# Patient Record
Sex: Female | Born: 1985 | Race: White | Hispanic: No | Marital: Married | Smoking: Never smoker
Health system: Southern US, Academic
[De-identification: ages and names within clinical notes are randomized; demographics above are authoritative.]

## PROBLEM LIST (undated history)

## (undated) DIAGNOSIS — F32A Depression, unspecified: Secondary | ICD-10-CM

## (undated) DIAGNOSIS — R32 Unspecified urinary incontinence: Secondary | ICD-10-CM

## (undated) DIAGNOSIS — K219 Gastro-esophageal reflux disease without esophagitis: Secondary | ICD-10-CM

## (undated) DIAGNOSIS — T50905A Adverse effect of unspecified drugs, medicaments and biological substances, initial encounter: Secondary | ICD-10-CM

## (undated) DIAGNOSIS — F419 Anxiety disorder, unspecified: Secondary | ICD-10-CM

## (undated) DIAGNOSIS — R634 Abnormal weight loss: Secondary | ICD-10-CM

## (undated) DIAGNOSIS — D509 Iron deficiency anemia, unspecified: Secondary | ICD-10-CM

## (undated) DIAGNOSIS — R55 Syncope and collapse: Secondary | ICD-10-CM

## (undated) HISTORY — DX: Gastro-esophageal reflux disease without esophagitis: K21.9

## (undated) HISTORY — PX: HX APPENDECTOMY: SHX54

## (undated) HISTORY — DX: Unspecified urinary incontinence: R32

## (undated) HISTORY — DX: Anxiety disorder, unspecified: F41.9

## (undated) HISTORY — DX: Depression, unspecified: F32.A

## (undated) HISTORY — PX: HX GALL BLADDER SURGERY/CHOLE: SHX55

## (undated) HISTORY — DX: Adverse effect of unspecified drugs, medicaments and biological substances, initial encounter: T50.905A

## (undated) HISTORY — PX: HX SALPINGECTOMY: 2100001146

## (undated) HISTORY — DX: Abnormal weight loss: R63.4

## (undated) HISTORY — DX: Iron deficiency anemia, unspecified: D50.9

---
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
## 2015-05-17 DIAGNOSIS — K8020 Calculus of gallbladder without cholecystitis without obstruction: Secondary | ICD-10-CM | POA: Insufficient documentation

## 2015-05-25 DIAGNOSIS — Z9889 Other specified postprocedural states: Secondary | ICD-10-CM | POA: Insufficient documentation

## 2017-02-24 DIAGNOSIS — Z789 Other specified health status: Secondary | ICD-10-CM | POA: Insufficient documentation

## 2018-09-22 DIAGNOSIS — Z9889 Other specified postprocedural states: Secondary | ICD-10-CM | POA: Insufficient documentation

## 2019-05-13 DIAGNOSIS — Z3041 Encounter for surveillance of contraceptive pills: Secondary | ICD-10-CM | POA: Insufficient documentation

## 2020-02-01 DIAGNOSIS — Z01818 Encounter for other preprocedural examination: Secondary | ICD-10-CM | POA: Insufficient documentation

## 2020-02-24 DIAGNOSIS — Z4889 Encounter for other specified surgical aftercare: Secondary | ICD-10-CM | POA: Insufficient documentation

## 2021-11-19 ENCOUNTER — Other Ambulatory Visit (RURAL_HEALTH_CENTER): Payer: Self-pay | Admitting: Physician Assistant

## 2021-11-29 ENCOUNTER — Other Ambulatory Visit (RURAL_HEALTH_CENTER): Payer: Self-pay | Admitting: Physician Assistant

## 2021-11-29 MED ORDER — PANTOPRAZOLE 40 MG TABLET,DELAYED RELEASE
40.0000 mg | DELAYED_RELEASE_TABLET | Freq: Every day | ORAL | 0 refills | Status: DC
Start: 2021-11-29 — End: 2021-12-20

## 2021-11-29 MED ORDER — LORAZEPAM 0.5 MG TABLET
0.5000 mg | ORAL_TABLET | Freq: Two times a day (BID) | ORAL | 0 refills | Status: DC | PRN
Start: 2021-11-29 — End: 2021-12-20

## 2021-11-29 MED ORDER — TIRZEPATIDE 2.5 MG/0.5 ML SUBCUTANEOUS PEN INJECTOR
2.5000 mg | PEN_INJECTOR | Freq: Once | SUBCUTANEOUS | Status: DC
Start: 2021-11-29 — End: 2021-12-20

## 2021-11-29 NOTE — Telephone Encounter (Signed)
RX approved and encounter closed

## 2021-12-07 ENCOUNTER — Encounter (RURAL_HEALTH_CENTER): Payer: Self-pay | Admitting: Physician Assistant

## 2021-12-07 DIAGNOSIS — D509 Iron deficiency anemia, unspecified: Secondary | ICD-10-CM

## 2021-12-07 DIAGNOSIS — F419 Anxiety disorder, unspecified: Secondary | ICD-10-CM | POA: Insufficient documentation

## 2021-12-07 DIAGNOSIS — R32 Unspecified urinary incontinence: Secondary | ICD-10-CM | POA: Insufficient documentation

## 2021-12-07 DIAGNOSIS — K219 Gastro-esophageal reflux disease without esophagitis: Secondary | ICD-10-CM | POA: Insufficient documentation

## 2021-12-07 DIAGNOSIS — R634 Abnormal weight loss: Secondary | ICD-10-CM | POA: Insufficient documentation

## 2021-12-07 DIAGNOSIS — F32A Depression, unspecified: Secondary | ICD-10-CM | POA: Insufficient documentation

## 2021-12-20 ENCOUNTER — Ambulatory Visit (RURAL_HEALTH_CENTER): Payer: Managed Care, Other (non HMO) | Attending: Physician Assistant | Admitting: Physician Assistant

## 2021-12-20 ENCOUNTER — Other Ambulatory Visit: Payer: Self-pay

## 2021-12-20 ENCOUNTER — Encounter (RURAL_HEALTH_CENTER): Payer: Self-pay | Admitting: Physician Assistant

## 2021-12-20 VITALS — BP 112/82 | HR 77 | Temp 98.4°F | Ht 63.0 in | Wt 235.0 lb

## 2021-12-20 DIAGNOSIS — Z2839 Other underimmunization status: Secondary | ICD-10-CM | POA: Insufficient documentation

## 2021-12-20 DIAGNOSIS — D509 Iron deficiency anemia, unspecified: Secondary | ICD-10-CM | POA: Insufficient documentation

## 2021-12-20 DIAGNOSIS — F32A Depression, unspecified: Secondary | ICD-10-CM | POA: Insufficient documentation

## 2021-12-20 DIAGNOSIS — Z2821 Immunization not carried out because of patient refusal: Secondary | ICD-10-CM | POA: Insufficient documentation

## 2021-12-20 DIAGNOSIS — Z6841 Body Mass Index (BMI) 40.0 and over, adult: Secondary | ICD-10-CM | POA: Insufficient documentation

## 2021-12-20 DIAGNOSIS — Z28310 Unvaccinated for covid-19: Secondary | ICD-10-CM | POA: Insufficient documentation

## 2021-12-20 DIAGNOSIS — R0683 Snoring: Secondary | ICD-10-CM | POA: Insufficient documentation

## 2021-12-20 DIAGNOSIS — R635 Abnormal weight gain: Secondary | ICD-10-CM | POA: Insufficient documentation

## 2021-12-20 DIAGNOSIS — F419 Anxiety disorder, unspecified: Secondary | ICD-10-CM | POA: Insufficient documentation

## 2021-12-20 DIAGNOSIS — R7309 Other abnormal glucose: Secondary | ICD-10-CM | POA: Insufficient documentation

## 2021-12-20 DIAGNOSIS — R5383 Other fatigue: Secondary | ICD-10-CM | POA: Insufficient documentation

## 2021-12-20 DIAGNOSIS — E785 Hyperlipidemia, unspecified: Secondary | ICD-10-CM | POA: Insufficient documentation

## 2021-12-20 MED ORDER — PANTOPRAZOLE 40 MG TABLET,DELAYED RELEASE
40.0000 mg | DELAYED_RELEASE_TABLET | Freq: Every day | ORAL | 0 refills | Status: DC
Start: 2021-12-20 — End: 2022-02-11

## 2021-12-20 MED ORDER — MOUNJARO 2.5 MG/0.5 ML SUBCUTANEOUS PEN INJECTOR
2.5000 mg | PEN_INJECTOR | SUBCUTANEOUS | 2 refills | Status: DC
Start: 2021-12-20 — End: 2022-01-15

## 2021-12-20 MED ORDER — LORAZEPAM 0.5 MG TABLET
0.5000 mg | ORAL_TABLET | Freq: Two times a day (BID) | ORAL | 2 refills | Status: DC | PRN
Start: 2021-12-20 — End: 2022-03-21

## 2021-12-20 MED ORDER — MOUNJARO 2.5 MG/0.5 ML SUBCUTANEOUS PEN INJECTOR
2.5000 mg | PEN_INJECTOR | SUBCUTANEOUS | 2 refills | Status: DC
Start: 2021-12-20 — End: 2021-12-20

## 2021-12-20 NOTE — Nursing Note (Signed)
Patient in office office today for 6 month follow up . Patient voiced no new complains.

## 2021-12-20 NOTE — Progress Notes (Signed)
INTERNAL MEDICINE, [facility] INTERNAL MEDICINE [facility]  [address]  [place] [address]  Operated by [facility]  History and Physical     Name: [name] [name] MRN:  [id number]   Date: 12/20/2021 Age: 36 y.o.               Follow Up        Reason for Visit: Medication Refill and Follow Up 6 Months    History of Present Illness  [name] [name] is a 36 y.o. female who is being seen today in office for follow-up as well as complaints of increased fatigue.  Patient states that her husband has also told her that she snores now in previously had no history.  Patient has had fatigue for some time with noted history of iron deficiency for which she is on iron supplementation.  She states fatigue however is worse at this current time and she is not sure if related to the iron or something else.  She does need labs upon today's visit.  Last labs noted hemoglobin stable but iron sats very low.  She does have a history of heavy menses noted.    Follow-up anxiety/depression for which patient is currently taking Ativan 0.5 twice daily; currently at this time she is not on any medications for depression and thinks most of her issues are anxiety related; overall Ativan seems to help calm her and specifically in the evenings helps her rest; she denies any panic attacks new issues or concerns.    Follow-up abnormal glucose with family history of diabetes noted.  Previous hemoglobin A1c levels have been within normal range the patient in need of follow-up labs today.  She denies increased thirst urination but has had weight gain.  Patient is interested in getting a prescription for [facility] to see if it will help weight loss as well as sugar control.  She states her sister is currently taking it it seems to be working well for her.  Requesting prescription to pharmacy be sent.  Currently her weight is 235 with a BMI of 41.63.    Patient also has a history of elevated cholesterol however this time  not taking statin therapy.  Once again weight loss is needed in patient states that she has tried to watch her diet admits to very low activity.  Follow-up cholesterol levels needed today.    Patient follows with GYN who recently placed her on Vesicare 5 mg daily for urinary incontinence    COVID/flu vaccination refused  PHQ Questionnaire  Little interest or pleasure in doing things.: Not at all  Feeling down, depressed, or hopeless: Not at all  PHQ 2 Total: 0            Past Medical History:   Diagnosis Date   . Anxiety    . Depression    . Esophageal reflux    . Iron deficiency anemia, unspecified    . Unspecified urinary incontinence    . Weight loss due to medication          Past Surgical History:   Procedure Laterality Date   . HX APPENDECTOMY     . HX CHOLECYSTECTOMY     . HX SALPINGECTOMY        Family Medical History:     Problem Relation (Age of Onset)    Arthritis-osteo Father    Coronary Artery Disease Mother    Diabetes type II Maternal Grandmother    Hypothyroidism Sister  Social History     Tobacco Use   . Smoking status: Never   . Smokeless tobacco: Never   Substance Use Topics   . Alcohol use: Never     Medication:  polysaccharide iron complex (FERREX 150) 150 mg iron Oral Capsule, Take 1 Capsule (150 mg total) by mouth Twice daily  solifenacin (VESICARE) 5 mg Oral Tablet, Take 1 Tablet (5 mg total) by mouth Once a day  LORazepam (ATIVAN) 0.5 mg Oral Tablet, Take 1 Tablet (0.5 mg total) by mouth Twice per day as needed for Anxiety  pantoprazole (PROTONIX) 40 mg Oral Tablet, Delayed Release (E.C.), Take 1 Tablet (40 mg total) by mouth Once a day    tirzepatide Pen Injector 2.5 mg, 2.5 mg, Once      Allergies:  Allergies   Allergen Reactions   . Cefuroxime Axetil      Vomiting? Unsure to just po med.   . Wellbutrin [Bupropion]        Physical Exam:  Vitals:    12/20/21 1027   BP: 112/82   Pulse: 77   Temp: 36.9 C (98.4 F)   TempSrc: Tympanic   SpO2: 96%   Weight: 107 kg (235 lb)   Height:  1.6 m (5\' 3" )   BMI: 41.72      Physical Exam  Vitals and nursing note reviewed.   Constitutional:       Appearance: She is obese.   HENT:      Right Ear: Tympanic membrane normal.      Left Ear: Tympanic membrane normal.      Mouth/Throat:      Mouth: Mucous membranes are moist.      Pharynx: Oropharynx is clear.   Eyes:      Pupils: Pupils are equal, round, and reactive to light.   Cardiovascular:      Rate and Rhythm: Normal rate and regular rhythm.      Pulses: Normal pulses.   Pulmonary:      Effort: Pulmonary effort is normal.      Breath sounds: Normal breath sounds.   Abdominal:      General: Bowel sounds are normal.      Palpations: Abdomen is soft.      Tenderness: There is no abdominal tenderness.   Musculoskeletal:         General: No swelling or tenderness. Normal range of motion.      Cervical back: Normal range of motion and neck supple.   Skin:     General: Skin is warm.      Findings: No lesion or rash.   Neurological:      General: No focal deficit present.      Mental Status: She is alert and oriented to person, place, and time.   Psychiatric:         Mood and Affect: Mood normal.         Behavior: Behavior normal.         Thought Content: Thought content normal.         Judgment: Judgment normal.         Assessment/Plan:  Problem List Items Addressed This Visit        Hematologic/Lymphatic    Iron deficiency anemia, unspecified       Psychiatric    Depression    Anxiety   Other Visit Diagnoses     Fatigue, unspecified type    -  Primary    Relevant Orders    CBC/DIFF  COMPREHENSIVE METABOLIC PNL, FASTING    MAGNESIUM    IRON TRANSFERRIN AND TIBC    THYROID STIMULATING HORMONE WITH FREE T4 REFLEX    POLYSOMNOGRAPHY-ANP OVERNIGHT-1ST NIGHT OF STUDY (F/U WITH CPAP IF ANP MEETS CLINICAL CRITERIA)    Snoring        Relevant Orders    POLYSOMNOGRAPHY-ANP OVERNIGHT-1ST NIGHT OF STUDY (F/U WITH CPAP IF ANP MEETS CLINICAL CRITERIA)    Abnormal glucose        Relevant Orders    COMPREHENSIVE METABOLIC  PNL, FASTING    HGA1C (HEMOGLOBIN A1C WITH EST AVG GLUCOSE)    Hyperlipidemia, unspecified hyperlipidemia type        Relevant Orders    LIPID PANEL    Weight gain               Lab order given to obtained at lab [facility]  Order sleep study overnight oximetry  Will try to get Mounjaro 2.5mg  weekly  Strict diet exercise wt loss encouraged  Meds refilled as noted  F/u pending results otherwise as scheduled       Follow up: Return in about 3 months (around 03/21/2022) for In Person Visit.  Seek medical attention for new or worsening symptoms.  Patient has been seen in this clinic within the last 3 years.     Ellanora Rayborn, PA-C          This note was partially created using MModal Fluency Direct system (voice recognition software) and is inherently subject to errors including those of syntax and "sound-alike" substitutions which may escape proofreading.  In such instances, original meaning may be extrapolated by contextual derivation.

## 2022-01-14 ENCOUNTER — Telehealth (RURAL_HEALTH_CENTER): Payer: Self-pay | Admitting: Physician Assistant

## 2022-01-14 NOTE — Telephone Encounter (Signed)
Patient called and was wondering about the [facility] prior authorization. It was denied by insurance so patient was wondering what she could try next.

## 2022-01-15 MED ORDER — WEGOVY 0.25 MG/0.5 ML SUBCUTANEOUS PEN INJECTOR
0.2500 mg | PEN_INJECTOR | SUBCUTANEOUS | 0 refills | Status: DC
Start: 2022-01-15 — End: 2022-03-21

## 2022-01-23 ENCOUNTER — Other Ambulatory Visit: Payer: Self-pay

## 2022-01-23 ENCOUNTER — Ambulatory Visit (RURAL_HEALTH_CENTER): Payer: Managed Care, Other (non HMO) | Attending: Family | Admitting: Family

## 2022-01-23 ENCOUNTER — Encounter (RURAL_HEALTH_CENTER): Payer: Self-pay | Admitting: Family

## 2022-01-23 VITALS — BP 122/72 | HR 88 | Temp 98.0°F | Resp 18 | Ht 63.0 in | Wt 238.0 lb

## 2022-01-23 DIAGNOSIS — J329 Chronic sinusitis, unspecified: Secondary | ICD-10-CM | POA: Insufficient documentation

## 2022-01-23 DIAGNOSIS — R0989 Other specified symptoms and signs involving the circulatory and respiratory systems: Secondary | ICD-10-CM

## 2022-01-23 DIAGNOSIS — R059 Cough, unspecified: Secondary | ICD-10-CM

## 2022-01-23 DIAGNOSIS — J020 Streptococcal pharyngitis: Secondary | ICD-10-CM | POA: Insufficient documentation

## 2022-01-23 DIAGNOSIS — J309 Allergic rhinitis, unspecified: Secondary | ICD-10-CM | POA: Insufficient documentation

## 2022-01-23 DIAGNOSIS — J3489 Other specified disorders of nose and nasal sinuses: Secondary | ICD-10-CM

## 2022-01-23 DIAGNOSIS — R519 Headache, unspecified: Secondary | ICD-10-CM

## 2022-01-23 LAB — POCT RAPID FLU
INFLUENZA TYPE A: NEGATIVE
INFLUENZA TYPE B: NEGATIVE

## 2022-01-23 LAB — POCT RAPID STREP A: RAPID STREP A (POCT): NEGATIVE

## 2022-01-23 MED ORDER — PENICILLIN G BENZATHINE 1,200,000 UNIT/2 ML INTRAMUSCULAR SYRINGE
1.2000 10*6.[IU] | INJECTION | INTRAMUSCULAR | Status: AC
Start: 2022-01-23 — End: 2022-01-23
  Administered 2022-01-23: 1.2 10*6.[IU] via INTRAMUSCULAR

## 2022-01-23 MED ORDER — FLUTICASONE PROPIONATE 50 MCG/ACTUATION NASAL SPRAY,SUSPENSION
2.0000 | Freq: Every day | NASAL | 1 refills | Status: AC
Start: 2022-01-23 — End: 2022-02-06

## 2022-01-23 MED ORDER — CETIRIZINE 10 MG TABLET
10.0000 mg | ORAL_TABLET | Freq: Every day | ORAL | 0 refills | Status: DC
Start: 2022-01-23 — End: 2022-05-23

## 2022-01-23 MED ORDER — PSEUDOEPHEDRINE-GUAIFENESIN ER 60 MG-600 MG TABLET,EXTEND RELEASE 12HR
1.0000 | ORAL_TABLET | Freq: Two times a day (BID) | ORAL | 0 refills | Status: AC
Start: 2022-01-23 — End: 2022-01-30

## 2022-01-23 MED ORDER — PREDNISONE 20 MG TABLET
40.0000 mg | ORAL_TABLET | Freq: Every day | ORAL | 0 refills | Status: AC
Start: 2022-01-23 — End: 2022-01-28

## 2022-01-23 NOTE — Nursing Note (Signed)
Patient here today with complaints of  Sore throat earache, cough, and diarrhea. Patient states this has been going on for about 3 weeks now.

## 2022-01-23 NOTE — Progress Notes (Signed)
FAMILY MEDICINE, [facility] FAMILY MEDICINE [facility]  [address]  [place] [address]  Operated by [facility]     Name: [name] [name] MRN:  [id number]   Date of Birth: 20-Jun-1986 Age: 36 y.o.   Date: 01/23/2022  Time: 15:24     Provider: Asa Lente, FNP    Reason for visit: Sore Throat and Cough (Congestion/)      History of Present Illness:  [name] [name] is a 36 y.o. female present to clinic today with c/o a 3 weeks of viral like symptoms. Which included sore throat,ear pain, cough and congestion. She tells me in the begining that [facility] and [facility] with [facility] for a virus. She tells me that at that time she was tested for covid, flu and strep, all were negative.  She tells me it improved with the abx until this past Friday, all the symptoms came back. By Saturday morning all symptoms had returned plus nausea, vomiting and diarrhea.    On Monday, she went back to [facility] and was start on another abx: Omnicef. But she tells me the coughing and the congestion has not improved. The ears and sore throat remains.   She not taking any OTC medications.   She tells on Saturday she had a lot of sinus pressure and headache. At that time she took some tylenol.   She goes on to further explain that her boys have been participating in baseball tournaments for the past 2 weeks and she had '' little to no rest"     Historical Data    Past Medical History:  Past Medical History:   Diagnosis Date   . Anxiety    . Depression    . Esophageal reflux    . Iron deficiency anemia, unspecified    . Unspecified urinary incontinence    . Weight loss due to medication      Past Surgical History:  Past Surgical History:   Procedure Laterality Date   . HX APPENDECTOMY     . HX CHOLECYSTECTOMY     . HX SALPINGECTOMY       Allergies:  Allergies   Allergen Reactions   . Cefuroxime Axetil      Vomiting? Unsure to just po med.   . Wellbutrin [Bupropion]      Medications:  Current  Outpatient Medications   Medication Sig   . cetirizine (ZYRTEC) 10 mg Oral Tablet Take 1 Tablet (10 mg total) by mouth Once a day for 90 days Indications: seasonal runny nose   . fluticasone propionate (FLONASE) 50 mcg/actuation Nasal Spray, Suspension Administer 2 Sprays into each nostril Once a day for 14 days Then decrease to 1 spray in each nostril to continue through allergy season.   Marland Kitchen LORazepam (ATIVAN) 0.5 mg Oral Tablet Take 1 Tablet (0.5 mg total) by mouth Twice per day as needed for Anxiety   . pantoprazole (PROTONIX) 40 mg Oral Tablet, Delayed Release (E.C.) Take 1 Tablet (40 mg total) by mouth Once a day   . polysaccharide iron complex (FERREX 150) 150 mg iron Oral Capsule Take 1 Capsule (150 mg total) by mouth Twice daily   . predniSONE (DELTASONE) 20 mg Oral Tablet Take 2 Tablets (40 mg total) by mouth Once a day for 5 days Indications: inflammation of the nose due to an allergy   . Pseudoephedrine-Guaifenesin (MUCINEX D) 60-600 mg Oral Tablet Sustained Release 12 hr Take 1 Tablet by mouth  Every 12 hours for 7 days Indications: a stuffy and runny nose, stuffy nose   . semaglutide, weight loss, (WEGOVY) 0.25 mg/0.5 mL Subcutaneous Pen Injector Inject 0.5 mL (0.25 mg total) under the skin Every 7 days for 30 days (Patient not taking: Reported on 01/23/2022)   . solifenacin (VESICARE) 5 mg Oral Tablet Take 1 Tablet (5 mg total) by mouth Once a day     Family History:  Family Medical History:     Problem Relation (Age of Onset)    Arthritis-osteo Father    Coronary Artery Disease Mother    Diabetes type II Maternal Grandmother    Hypothyroidism Sister          Social History:  Social History     Socioeconomic History   . Marital status: Married   Tobacco Use   . Smoking status: Never   . Smokeless tobacco: Never   Substance and Sexual Activity   . Alcohol use: Never           Review of Systems:  Any pertinent Review of Systems as addressed in the HPI above.    Physical Exam:  Vital Signs:  Vitals:     01/23/22 1428   BP: 122/72   Pulse: 88   Resp: 18   Temp: 36.7 C (98 F)   TempSrc: Oral   SpO2: 98%   Weight: 108 kg (238 lb)   Height: 1.6 m (5\' 3" )   BMI: 42.25     Physical Exam  Constitutional:       General: She is not in acute distress.     Appearance: Normal appearance. She is not ill-appearing (obviously not feeling her best).   HENT:      Head: Normocephalic.      Right Ear: Ear canal and external ear normal. Tympanic membrane is bulging (mild). Tympanic membrane is not injected.      Left Ear: Ear canal and external ear normal. Tympanic membrane is bulging (mild). Tympanic membrane is not injected.      Nose: Mucosal edema, congestion and rhinorrhea present. Rhinorrhea is purulent.      Mouth/Throat:      Lips: Pink.      Mouth: Mucous membranes are moist.      Pharynx: Pharyngeal swelling and posterior oropharyngeal erythema present. No oropharyngeal exudate.      Tonsils: No tonsillar exudate.      Comments: Congested cough  Eyes:      General: Lids are normal. Allergic shiner present.      Conjunctiva/sclera: Conjunctivae normal.   Cardiovascular:      Rate and Rhythm: Normal rate and regular rhythm.      Pulses: Normal pulses.      Heart sounds: Normal heart sounds. No murmur heard.  Pulmonary:      Effort: Pulmonary effort is normal.      Breath sounds: Normal breath sounds. No wheezing, rhonchi or rales.   Abdominal:      Palpations: Abdomen is soft.   Musculoskeletal:      Cervical back: Neck supple. Muscular tenderness present.   Lymphadenopathy:      Cervical: Cervical adenopathy present.   Skin:     General: Skin is warm.      Capillary Refill: Capillary refill takes less than 2 seconds.   Neurological:      General: No focal deficit present.      Mental Status: She is alert and oriented to person, place, and time. Mental status is at  baseline.          Assessment and Plan:    ICD-10-CM    1. Strep pharyngitis  J02.0       2. Allergic rhinitis  J30.9       3. Sinusitis  J32.9       4. Cough   R05.9       5. Chest congestion  R09.89       6. Headache  R51.9       7. Sinus pressure  J34.89          Orders Placed This Encounter   . fluticasone propionate (FLONASE) 50 mcg/actuation Nasal Spray, Suspension   . cetirizine (ZYRTEC) 10 mg Oral Tablet   . Pseudoephedrine-Guaifenesin (MUCINEX D) 60-600 mg Oral Tablet Sustained Release 12 hr   . predniSONE (DELTASONE) 20 mg Oral Tablet   . penicillin G benzathine (BICILLIN LA) 1,200,000 units/2 mL IM injection     This is probably a sinusitis/ rhinitis that has progressed due to seasonal allergies and then she has developed an exposure to strep. She has completed a round of doxycycline, and is currently on Omnicef. I will give her a shot of PCN G for the strep. The others should take care of what's left of the sinusitis.   I am also prescribing a bust of steroids and 5 days of Sudafed with guaifenesin, to reduce the inflammation and relieve the congestion. I have advised her to start Flonase and zyrtec to prevent the allergies. Explaining that the Flonase will also help relieve the ear pain/ pressure. She can continue to use acetaminophen for discomfort.   Plan of care discussed at length and agreed upon.    I emphasized the need for her to make  time to rest in order to start feeling better. Reminding her to push fluids, and discard her toothbrush in 24-48 hours.    Written info printed.     Zamarion Longest L Coleman Kalas, FNP     Portions of this note may be dictated using voice recognition software or a dictation service. Variances in spelling and vocabulary are possible and unintentional. Not all errors are caught/corrected. Please notify the Thereasa Parkin if any discrepancies are noted or if the meaning of any statement is not clear.

## 2022-02-08 ENCOUNTER — Other Ambulatory Visit (RURAL_HEALTH_CENTER): Payer: Self-pay | Admitting: Physician Assistant

## 2022-02-18 ENCOUNTER — Encounter (HOSPITAL_COMMUNITY): Payer: Self-pay

## 2022-02-18 ENCOUNTER — Ambulatory Visit (HOSPITAL_COMMUNITY): Payer: Self-pay

## 2022-03-13 ENCOUNTER — Other Ambulatory Visit (RURAL_HEALTH_CENTER): Payer: Self-pay

## 2022-03-13 DIAGNOSIS — R5383 Other fatigue: Secondary | ICD-10-CM

## 2022-03-13 DIAGNOSIS — E785 Hyperlipidemia, unspecified: Secondary | ICD-10-CM

## 2022-03-13 DIAGNOSIS — R7309 Other abnormal glucose: Secondary | ICD-10-CM

## 2022-03-21 ENCOUNTER — Encounter (INDEPENDENT_AMBULATORY_CARE_PROVIDER_SITE_OTHER): Payer: Self-pay | Admitting: Physician Assistant

## 2022-03-21 ENCOUNTER — Other Ambulatory Visit: Payer: Self-pay

## 2022-03-21 ENCOUNTER — Ambulatory Visit: Payer: Managed Care, Other (non HMO) | Attending: Physician Assistant | Admitting: Physician Assistant

## 2022-03-21 VITALS — BP 124/74 | HR 79 | Temp 97.8°F | Ht 63.0 in | Wt 239.0 lb

## 2022-03-21 DIAGNOSIS — F32A Depression, unspecified: Secondary | ICD-10-CM | POA: Insufficient documentation

## 2022-03-21 DIAGNOSIS — R5382 Chronic fatigue, unspecified: Secondary | ICD-10-CM | POA: Insufficient documentation

## 2022-03-21 DIAGNOSIS — F419 Anxiety disorder, unspecified: Secondary | ICD-10-CM | POA: Insufficient documentation

## 2022-03-21 DIAGNOSIS — R7309 Other abnormal glucose: Secondary | ICD-10-CM | POA: Insufficient documentation

## 2022-03-21 DIAGNOSIS — E782 Mixed hyperlipidemia: Secondary | ICD-10-CM | POA: Insufficient documentation

## 2022-03-21 DIAGNOSIS — R635 Abnormal weight gain: Secondary | ICD-10-CM | POA: Insufficient documentation

## 2022-03-21 DIAGNOSIS — D509 Iron deficiency anemia, unspecified: Secondary | ICD-10-CM | POA: Insufficient documentation

## 2022-03-21 MED ORDER — WEGOVY 0.25 MG/0.5 ML SUBCUTANEOUS PEN INJECTOR
0.2500 mg | PEN_INJECTOR | SUBCUTANEOUS | 0 refills | Status: DC
Start: 2022-03-21 — End: 2022-05-23

## 2022-03-21 MED ORDER — LORAZEPAM 0.5 MG TABLET
0.5000 mg | ORAL_TABLET | Freq: Two times a day (BID) | ORAL | 2 refills | Status: DC | PRN
Start: 2022-03-21 — End: 2022-05-23

## 2022-03-21 NOTE — Nursing Note (Signed)
Patient in office for 3 month visit with refills.

## 2022-03-21 NOTE — Progress Notes (Signed)
INTERNAL MEDICINE, [facility]  [address]  [place] [address]  Operated by [facility]  History and Physical     Name: [name] [name] MRN:  [id number]   Date: 03/21/2022 Age: 36 y.o.               Follow Up        Reason for Visit: Follow Up 3 Months weight gain    History of Present Illness  [name] [name] is a 36 y.o. female who is being seen today in office for follow-up today.  Patient does have a history of abnormal glucose with family history of diabetes noted.  Previous hemoglobin A1c levels have been within normal range and patient was given Mounjaro to see if it will help weight loss as well as sugar control.  Patient states her insurance would not pay for the medication so she is currently not taking anything and weight gain is noted.  Currently her weight is 235 with a BMI of 41.63.  She is requesting today to see if something else such as Reginal Lutes could be sent to help with weight loss.  Patient states her goal weight would be around 150-160 if possible but she has a hard time finding time to exercise was work and kids in running etc. at this time no increased thirst urination reported.    Follow-up anxiety/depression for which patient is currently taking Ativan 0.5 twice daily; currently at this time she is not on any medications for depression and thinks most of her issues are anxiety related.  Reports Ativan seems to help calm her and specifically in the evenings helps her rest; she denies any panic attacks new issues or concerns.  Requesting medication refill.    Follow-up history of elevated cholesterol/triglycerides was last reading 225.  Patient this time is not on statin therapy.  Once again weight loss is needed in patient states that she has tried to watch her diet admits to very low activity.      Follow-up low iron with patient now taking iron supplement.  She does have a history of heavy menses as well as chronic fatigue noted.  Denies any abdominal pain bowel  changes.  We also discussed possibility of sleep apnea causing some of her fatigue issues overnight sleep test was ordered but patient states she never completed the test at this time would like to wait.    Patient follows with GYN routinely.  He also prescribes her Vesicare 5 mg daily for urinary incontinence    PHQ Questionnaire  Little interest or pleasure in doing things.: Not at all  Feeling down, depressed, or hopeless: Not at all  PHQ 2 Total: 0            Past Medical History:   Diagnosis Date    Anxiety     Depression     Esophageal reflux     Iron deficiency anemia, unspecified     Unspecified urinary incontinence     Weight loss due to medication          Past Surgical History:   Procedure Laterality Date    HX APPENDECTOMY      HX CHOLECYSTECTOMY      HX SALPINGECTOMY        Family Medical History:       Problem Relation (Age of Onset)    Arthritis-osteo Father    Coronary Artery Disease Mother    Diabetes type II Maternal Grandmother  Hypothyroidism Sister            Social History     Tobacco Use    Smoking status: Never    Smokeless tobacco: Never   Substance Use Topics    Alcohol use: Never     Medication:  cetirizine (ZYRTEC) 10 mg Oral Tablet, Take 1 Tablet (10 mg total) by mouth Once a day for 90 days Indications: seasonal runny nose (Patient not taking: Reported on 03/21/2022)  polysaccharide iron complex (FERREX 150) 150 mg iron Oral Capsule, Take 1 Capsule (150 mg total) by mouth Twice daily  solifenacin (VESICARE) 5 mg Oral Tablet, Take 1 Tablet (5 mg total) by mouth Once a day  LORazepam (ATIVAN) 0.5 mg Oral Tablet, Take 1 Tablet (0.5 mg total) by mouth Twice per day as needed for Anxiety  pantoprazole (PROTONIX) 40 mg Oral Tablet, Delayed Release (E.C.), TAKE 1 TABLET by mouth ONCE DAILY  semaglutide, weight loss, (WEGOVY) 0.25 mg/0.5 mL Subcutaneous Pen Injector, Inject 0.5 mL (0.25 mg total) under the skin Every 7 days for 30 days (Patient not taking: Reported on 01/23/2022)    No  facility-administered medications prior to visit.    Allergies:  Allergies   Allergen Reactions    Cefuroxime Axetil      Vomiting? Unsure to just po med.    Wellbutrin [Bupropion]        Physical Exam:  Vitals:    03/21/22 1022   BP: 124/74   Pulse: 79   Temp: 36.6 C (97.8 F)   TempSrc: Tympanic   SpO2: 97%   Weight: 108 kg (239 lb)   Height: 1.6 m (5\' 3" )   BMI: 42.43      Physical Exam  Vitals and nursing note reviewed.   Constitutional:       Appearance: She is obese.   HENT:      Right Ear: Tympanic membrane normal.      Left Ear: Tympanic membrane normal.      Mouth/Throat:      Mouth: Mucous membranes are moist.      Pharynx: Oropharynx is clear.   Eyes:      Pupils: Pupils are equal, round, and reactive to light.   Cardiovascular:      Rate and Rhythm: Normal rate and regular rhythm.      Pulses: Normal pulses.   Pulmonary:      Effort: Pulmonary effort is normal.      Breath sounds: Normal breath sounds.   Abdominal:      General: Bowel sounds are normal.      Palpations: Abdomen is soft.      Tenderness: There is no abdominal tenderness.   Musculoskeletal:         General: No swelling or tenderness. Normal range of motion.      Cervical back: Normal range of motion and neck supple.   Skin:     General: Skin is warm.      Findings: No lesion or rash.   Neurological:      General: No focal deficit present.      Mental Status: She is alert and oriented to person, place, and time.   Psychiatric:         Mood and Affect: Mood normal.         Behavior: Behavior normal.         Thought Content: Thought content normal.         Judgment: Judgment normal.  Assessment/Plan:  Problem List Items Addressed This Visit          Cardiovascular System    Elevated triglycerides with high cholesterol       Hematologic/Lymphatic    Iron deficiency anemia, unspecified       Psychiatric    Depression    Anxiety       Other    Abnormal glucose    Chronic fatigue     Other Visit Diagnoses       Weight gain    -  Primary            Labs up-to-date this time  Wegovy 0.25 mg weekly prescribed pending insurance approval  To continue Strict diet exercise wt loss   Ativan refilled as noted  Once again patient did not complete overnight sleep study and refuses reordered this time       Follow up:   Post-Discharge Follow Up Appointments       Thursday Jun 20, 2022    Appointment with Adline Mango, MD at 12:15 PM      Tuesday Jun 25, 2022    Return Patient Visit with Eyvonne Mechanic, PA-C at  9:00 AM      Thursday Aug 29, 2022    Appointment with Eyvonne Mechanic, PA-C at  9:30 AM      Internal Medicine, Building [facility]  [facility]  [address]  [place] [address]  [phone]           Seek medical attention for new or worsening symptoms.  Patient has been seen in this clinic within the last 3 years.       I personally reviewed the documentation of care provided by the certified physician assistant and I agree with her medical decision making, Weyman Pedro, D.O.     Amy Alvis, PA-C          This note was partially created using MModal Fluency Direct system (voice recognition software) and is inherently subject to errors including those of syntax and "sound-alike" substitutions which may escape proofreading.  In such instances, original meaning may be extrapolated by contextual derivation.

## 2022-03-22 ENCOUNTER — Other Ambulatory Visit (RURAL_HEALTH_CENTER): Payer: Self-pay | Admitting: Physician Assistant

## 2022-03-22 ENCOUNTER — Telehealth (INDEPENDENT_AMBULATORY_CARE_PROVIDER_SITE_OTHER): Payer: Self-pay | Admitting: Physician Assistant

## 2022-03-22 MED ORDER — PHENTERMINE 37.5 MG TABLET
37.5000 mg | ORAL_TABLET | Freq: Every morning | ORAL | 1 refills | Status: DC
Start: 2022-03-22 — End: 2022-03-27

## 2022-03-22 NOTE — Telephone Encounter (Signed)
PATIENT CALLED SAYING MONJARO WASN'T APPROVED AND SAID YOU WILL SENT HER IN PILLS.

## 2022-03-27 ENCOUNTER — Inpatient Hospital Stay (HOSPITAL_BASED_OUTPATIENT_CLINIC_OR_DEPARTMENT_OTHER)
Admission: RE | Admit: 2022-03-27 | Discharge: 2022-03-27 | Disposition: A | Discharge status: Home / Self Care | Payer: Managed Care, Other (non HMO) | Source: Ambulatory Visit

## 2022-03-27 ENCOUNTER — Ambulatory Visit: Payer: Managed Care, Other (non HMO) | Attending: Physician Assistant | Admitting: Physician Assistant

## 2022-03-27 ENCOUNTER — Other Ambulatory Visit: Payer: Self-pay

## 2022-03-27 ENCOUNTER — Other Ambulatory Visit (INDEPENDENT_AMBULATORY_CARE_PROVIDER_SITE_OTHER): Payer: Self-pay | Admitting: Physician Assistant

## 2022-03-27 ENCOUNTER — Inpatient Hospital Stay (HOSPITAL_BASED_OUTPATIENT_CLINIC_OR_DEPARTMENT_OTHER)
Admission: RE | Admit: 2022-03-27 | Discharge: 2022-03-27 | Disposition: A | Discharge status: Home / Self Care | Payer: Managed Care, Other (non HMO) | Source: Ambulatory Visit | Attending: Physician Assistant | Admitting: Physician Assistant

## 2022-03-27 ENCOUNTER — Encounter (INDEPENDENT_AMBULATORY_CARE_PROVIDER_SITE_OTHER): Payer: Self-pay | Admitting: Physician Assistant

## 2022-03-27 VITALS — BP 112/86 | HR 86 | Temp 98.6°F | Ht 63.0 in | Wt 235.0 lb

## 2022-03-27 DIAGNOSIS — R1030 Lower abdominal pain, unspecified: Secondary | ICD-10-CM

## 2022-03-27 DIAGNOSIS — R55 Syncope and collapse: Secondary | ICD-10-CM

## 2022-03-27 DIAGNOSIS — R5383 Other fatigue: Secondary | ICD-10-CM | POA: Insufficient documentation

## 2022-03-27 DIAGNOSIS — E782 Mixed hyperlipidemia: Secondary | ICD-10-CM

## 2022-03-27 DIAGNOSIS — R809 Proteinuria, unspecified: Secondary | ICD-10-CM | POA: Insufficient documentation

## 2022-03-27 NOTE — Nursing Note (Signed)
03/27/22 1500   Urine test  (Siemens Multistix 10 SG)   Performed Status: Manual   Time collected 1531   Color (Ref Range: Yellow) (!) Dark Yellow   Clarity (Ref Range: Clear) (!) Cloudy   Glucose (Ref Range: Negative mg/dL) Negative   Bilirubin (Ref Range: Negative mg/dL) (!) 1+   Ketones (Ref Range: Negative mg/dL) Negative   Urine Specific Gravity (Ref Range: 1.005 - 1.030) 1.015   Blood (urine) (Ref Range: Negative mg/dL) Negative   pH (Ref Range: 5.0 - 8.0) 6.5   Protein (Ref Range: Negative mg/dL) (!) Trace   Urobilinogen (Ref Range: Negative mg/dL) 0.2mg /dL (Normal)   Nitrite (Ref Range: Negative) Negative   Leukocytes (Ref Range: Negative WBC's/uL) Negative   Initials KD

## 2022-03-27 NOTE — Progress Notes (Signed)
[facility] Medicine  INTERNAL MEDICINE, BUILDING A  Operated [facility]  Progress Note    Name: [name] [name] MRN:  [id number]   Date: 03/27/2022 Age: 36 y.o.          Chief Complaint: Syncope       HPI: [name] [name] is a 36 y.o. female who complains of  syncope yesterday after experiencing acute lower abdominal pain.  Patient states that she would taken a shower and upon getting out of the shower had acute lower abdominal pain that she described as severe like when she had contractions during labor.  Patient states after that she does not remember approximally 5 minutes of time where she apparently called her sister mother husband without recollection.  Her mother and sister arrived to her home at which time found her in the bathroom confused to her surrounding and still complaining of abdominal pain.  Patient states at that time she had a bowel movement which was not loose or diarrhea but very yellow in color.  After the bowel movement pain was resolved but she had a tingling sensation all over last drank a protein drink and 15-20 minutes later felt significantly better.  Patient states that she was fully hydrated prior to the incident and had drank 340 oz Stanley cups follow water as well as 3 bottles of water.  She had no new medications and had ate shortly before incident to does not believe it was related to a low sugar attack.  Patient states that she is had episodes like this off and on her entire life starting when she was age 42.  She was taken to a neurologist years ago who told her she had "inch warm blood supply" today felt like she needs to stay hydrated in order to keep her blood moving properly.  Patient did admit to taking the Adipex yesterday morning but states she is used this medication in ever had any kind of issues.  She denied any other headaches dizziness chest pain and today feels fine other than states she is a little more tired.  Patient also knows that right after her issue  blood pressure was low at 76/59 but her sugar was 124    Allergies:  Allergies   Allergen Reactions    Cefuroxime Axetil      Vomiting? Unsure to just po med.    Wellbutrin [Bupropion]      Current medications:  cetirizine (ZYRTEC) 10 mg Oral Tablet, Take 1 Tablet (10 mg total) by mouth Once a day for 90 days Indications: seasonal runny nose (Patient not taking: Reported on 03/21/2022)  LORazepam (ATIVAN) 0.5 mg Oral Tablet, Take 1 Tablet (0.5 mg total) by mouth Twice per day as needed for Anxiety  pantoprazole (PROTONIX) 40 mg Oral Tablet, Delayed Release (E.C.), TAKE 1 TABLET by mouth ONCE DAILY  polysaccharide iron complex (FERREX 150) 150 mg iron Oral Capsule, Take 1 Capsule (150 mg total) by mouth Twice daily  semaglutide, weight loss, (WEGOVY) 0.25 mg/0.5 mL Subcutaneous Pen Injector, Inject 0.5 mL (0.25 mg total) under the skin Every 7 days for 30 days  solifenacin (VESICARE) 5 mg Oral Tablet, Take 1 Tablet (5 mg total) by mouth Once a day  phentermine (ADIPEX-P) 37.5 mg Oral Tablet, Take 1 Tablet (37.5 mg total) by mouth Every morning before breakfast    No facility-administered medications prior to visit.     Social History     Socioeconomic History    Marital status: Married  Tobacco Use    Smoking status: Never    Smokeless tobacco: Never   Substance and Sexual Activity    Alcohol use: Never      Past Surgical History:   Procedure Laterality Date    HX APPENDECTOMY      HX CHOLECYSTECTOMY      HX SALPINGECTOMY        Past Medical History:   Diagnosis Date    Anxiety     Depression     Esophageal reflux     Iron deficiency anemia, unspecified     Unspecified urinary incontinence     Weight loss due to medication       OBJECTIVE:  Vitals:    03/27/22 1347 03/27/22 1348 03/27/22 1349   BP: 118/88 118/84 112/86   Pulse: 86     Temp: 37 C (98.6 F)     TempSrc: Tympanic     SpO2: 98%     Weight: 107 kg (235 lb)     Height: 1.6 m (5\' 3" )     BMI: 41.72        Physical Exam  Vitals and nursing note reviewed.    Constitutional:       Appearance: Normal appearance. She is obese.   HENT:      Right Ear: Tympanic membrane normal.      Left Ear: Tympanic membrane normal.      Mouth/Throat:      Mouth: Mucous membranes are moist.      Pharynx: Oropharynx is clear.   Eyes:      Pupils: Pupils are equal, round, and reactive to light.   Cardiovascular:      Rate and Rhythm: Normal rate and regular rhythm.      Pulses: Normal pulses.   Pulmonary:      Effort: Pulmonary effort is normal.      Breath sounds: Normal breath sounds.   Abdominal:      General: Bowel sounds are normal.      Palpations: Abdomen is soft.      Tenderness: There is no abdominal tenderness.   Musculoskeletal:         General: No swelling or tenderness. Normal range of motion.      Cervical back: Normal range of motion and neck supple.   Skin:     General: Skin is warm.      Findings: No lesion or rash.   Neurological:      General: No focal deficit present.      Mental Status: She is alert and oriented to person, place, and time.      Cranial Nerves: No cranial nerve deficit.      Sensory: No sensory deficit.      Motor: No weakness.      Gait: Gait normal.   Psychiatric:         Mood and Affect: Mood normal.         Behavior: Behavior normal.         Thought Content: Thought content normal.         Judgment: Judgment normal.        Urine Dip Results:   Time collected: 1531  Glucose (Ref Range: Negative mg/dL): Negative  Bilirubin (Ref Range: Negative mg/dL): (!) Small  Ketones (Ref Range: Negative mg/dL): Negative  Urine Specific Gravity (Ref Range: 1.005 - 1.030): 1.015  Blood (urine) (Ref Range: Negative mg/dL): Negative  pH (Ref Range: 5.0 - 8.0): 6.5  Protein (Ref Range: Negative  mg/dL): (!) Trace  Urobilinogen (Ref Range: Negative mg/dL): 0.2mg /dL (Normal)  Nitrite (Ref Range: Negative): Negative  Leukocytes (Ref Range: Negative WBC's/uL): Negative     HCG Results:    neg        Orthostatic Vitals     Laying 118/84  Sitting 116/82  Standing 112/86          Amy Alvis, PA-C 03/27/2022, 16:48   ASSESSMENT:     ICD-10-CM    1. Syncope, unspecified syncope type  R55 CT BRAIN WO IV CONTRAST     POCT URINE DIPSTICK     3 DAY EXTENDED HOLTER MONITOR     Referral to External Provider (AMB)     93000 - ECG In Clinic ([facility])     CANCELED: CT ABDOMEN WO IV CONTRAST      2. Lower abdominal pain  R10.30 CANCELED: CT ABDOMEN WO IV CONTRAST      3. Fatigue, unspecified type  R53.83       4. Proteinuria, unspecified type  R80.9           PLAN:  Labs ordered at [facility]  Microscopic urinalysis also obtained  CT of head as well as abdomen pelvis obtained.  At this time due to patient's noted symptoms feel like a delay in care could seriously jeopardize patient's health thus testing ordered stat.    Holter monitor ordered  Referred to neurologist as well as cardiologist  Continue proper hydration  DC Adipex  Will follow-up pending lab and CT results  Any acute issues seek ER.    Post-Discharge Follow Up Appointments       Tuesday Apr 02, 2022    Holter Monitor with PRN STRESS BAY 3 at 11:00 AM      Monday Jun 17, 2022    Return Patient Visit with Eyvonne Mechanic, PA-C at  9:00 AM      Internal Medicine, Building A  Building [facility]  [address]  [place] [address]  [phone] [facility]  [facility] [place]   [address]  [place] [address]  [phone]             Amy Hyman Bible, PA-C      I personally reviewed the documentation of care provided by the PA-C, Weyman Pedro, D.O.

## 2022-03-27 NOTE — Nursing Note (Signed)
Patient states that she passed out yesterday, but the syncope episodes have been happening for a while she just has not mentioned them in previous visits. Patient did go to ER but did not stay to be examined.

## 2022-03-28 ENCOUNTER — Telehealth (INDEPENDENT_AMBULATORY_CARE_PROVIDER_SITE_OTHER): Payer: Self-pay | Admitting: Physician Assistant

## 2022-03-28 ENCOUNTER — Other Ambulatory Visit (INDEPENDENT_AMBULATORY_CARE_PROVIDER_SITE_OTHER): Payer: Self-pay | Admitting: Physician Assistant

## 2022-03-28 MED ORDER — METRONIDAZOLE 500 MG TABLET
500.0000 mg | ORAL_TABLET | Freq: Three times a day (TID) | ORAL | 0 refills | Status: DC
Start: 2022-03-28 — End: 2022-05-23

## 2022-03-29 ENCOUNTER — Other Ambulatory Visit (INDEPENDENT_AMBULATORY_CARE_PROVIDER_SITE_OTHER): Payer: Self-pay | Admitting: Physician Assistant

## 2022-03-29 DIAGNOSIS — R55 Syncope and collapse: Secondary | ICD-10-CM

## 2022-03-29 DIAGNOSIS — K219 Gastro-esophageal reflux disease without esophagitis: Secondary | ICD-10-CM

## 2022-03-29 DIAGNOSIS — R5383 Other fatigue: Secondary | ICD-10-CM

## 2022-03-29 DIAGNOSIS — R1030 Lower abdominal pain, unspecified: Secondary | ICD-10-CM

## 2022-04-01 ENCOUNTER — Telehealth (INDEPENDENT_AMBULATORY_CARE_PROVIDER_SITE_OTHER): Payer: Self-pay | Admitting: Physician Assistant

## 2022-04-02 ENCOUNTER — Inpatient Hospital Stay
Admission: RE | Admit: 2022-04-02 | Discharge: 2022-04-02 | Disposition: A | Discharge status: Home / Self Care | Payer: Managed Care, Other (non HMO) | Source: Ambulatory Visit | Attending: Physician Assistant | Admitting: Physician Assistant

## 2022-04-02 ENCOUNTER — Other Ambulatory Visit: Payer: Self-pay

## 2022-04-02 DIAGNOSIS — R55 Syncope and collapse: Secondary | ICD-10-CM | POA: Insufficient documentation

## 2022-04-12 ENCOUNTER — Telehealth (INDEPENDENT_AMBULATORY_CARE_PROVIDER_SITE_OTHER): Payer: Self-pay | Admitting: Physician Assistant

## 2022-04-12 LAB — 3 DAY EXTENDED HOLTER MONITOR: Isolated SVE count: 17 episodes

## 2022-04-15 ENCOUNTER — Other Ambulatory Visit (INDEPENDENT_AMBULATORY_CARE_PROVIDER_SITE_OTHER): Payer: Self-pay | Admitting: Physician Assistant

## 2022-04-15 DIAGNOSIS — R55 Syncope and collapse: Secondary | ICD-10-CM

## 2022-04-15 DIAGNOSIS — R5383 Other fatigue: Secondary | ICD-10-CM

## 2022-04-15 LAB — 3 DAY EXTENDED HOLTER MONITOR
Heart rate (average): 89 {beats}/min
Isolated VE Counts: 414 episodes

## 2022-04-16 ENCOUNTER — Telehealth (INDEPENDENT_AMBULATORY_CARE_PROVIDER_SITE_OTHER): Payer: Self-pay | Admitting: Physician Assistant

## 2022-04-16 ENCOUNTER — Other Ambulatory Visit (INDEPENDENT_AMBULATORY_CARE_PROVIDER_SITE_OTHER): Payer: Self-pay | Admitting: Physician Assistant

## 2022-04-16 DIAGNOSIS — R809 Proteinuria, unspecified: Secondary | ICD-10-CM

## 2022-04-16 MED ORDER — ROSUVASTATIN 5 MG TABLET
5.0000 mg | ORAL_TABLET | Freq: Every evening | ORAL | 1 refills | Status: DC
Start: 2022-04-16 — End: 2022-05-23

## 2022-05-05 ENCOUNTER — Other Ambulatory Visit (RURAL_HEALTH_CENTER): Payer: Self-pay | Admitting: Physician Assistant

## 2022-05-05 DIAGNOSIS — R5382 Chronic fatigue, unspecified: Secondary | ICD-10-CM | POA: Insufficient documentation

## 2022-05-05 DIAGNOSIS — R7309 Other abnormal glucose: Secondary | ICD-10-CM | POA: Insufficient documentation

## 2022-05-05 DIAGNOSIS — E782 Mixed hyperlipidemia: Secondary | ICD-10-CM | POA: Insufficient documentation

## 2022-05-08 ENCOUNTER — Other Ambulatory Visit (INDEPENDENT_AMBULATORY_CARE_PROVIDER_SITE_OTHER): Payer: Self-pay

## 2022-05-08 DIAGNOSIS — R55 Syncope and collapse: Secondary | ICD-10-CM

## 2022-05-08 DIAGNOSIS — E782 Mixed hyperlipidemia: Secondary | ICD-10-CM

## 2022-05-20 ENCOUNTER — Emergency Department (HOSPITAL_COMMUNITY): Payer: Managed Care, Other (non HMO)

## 2022-05-20 ENCOUNTER — Encounter (HOSPITAL_COMMUNITY): Payer: Self-pay | Admitting: Emergency Medicine

## 2022-05-20 ENCOUNTER — Emergency Department
Admission: EM | Admit: 2022-05-20 | Discharge: 2022-05-20 | Disposition: A | Discharge status: Home / Self Care | Payer: Managed Care, Other (non HMO) | Attending: Emergency Medicine | Admitting: Emergency Medicine

## 2022-05-20 ENCOUNTER — Other Ambulatory Visit: Payer: Self-pay

## 2022-05-20 DIAGNOSIS — W19XXXA Unspecified fall, initial encounter: Secondary | ICD-10-CM | POA: Insufficient documentation

## 2022-05-20 DIAGNOSIS — R569 Unspecified convulsions: Secondary | ICD-10-CM | POA: Insufficient documentation

## 2022-05-20 DIAGNOSIS — R55 Syncope and collapse: Secondary | ICD-10-CM | POA: Insufficient documentation

## 2022-05-20 LAB — COMPREHENSIVE METABOLIC PANEL, NON-FASTING
ALBUMIN/GLOBULIN RATIO: 1.6 — ABNORMAL HIGH (ref 0.8–1.4)
ALBUMIN: 4.2 g/dL (ref 3.5–5.7)
ALKALINE PHOSPHATASE: 82 U/L (ref 34–104)
ALT (SGPT): 16 U/L (ref 7–52)
ANION GAP: 8 mmol/L (ref 4–13)
AST (SGOT): 22 U/L (ref 13–39)
BILIRUBIN TOTAL: 0.6 mg/dL (ref 0.3–1.2)
BUN/CREA RATIO: 21 (ref 6–22)
BUN: 16 mg/dL (ref 7–25)
CALCIUM, CORRECTED: 9.7 mg/dL (ref 8.9–10.8)
CALCIUM: 9.9 mg/dL (ref 8.6–10.3)
CHLORIDE: 106 mmol/L (ref 98–107)
CO2 TOTAL: 23 mmol/L (ref 21–31)
CREATININE: 0.77 mg/dL (ref 0.60–1.30)
ESTIMATED GFR: 104 mL/min/{1.73_m2} (ref >59)
GLOBULIN: 2.6 — ABNORMAL LOW (ref 2.9–5.4)
GLUCOSE: 83 mg/dL (ref 74–109)
OSMOLALITY, CALCULATED: 274 mOsm/kg (ref 270–290)
POTASSIUM: 4.2 mmol/L (ref 3.5–5.1)
PROTEIN TOTAL: 6.8 g/dL (ref 6.4–8.9)
SODIUM: 137 mmol/L (ref 136–145)

## 2022-05-20 LAB — TROPONIN-I: TROPONIN I: <2 ng/L (ref <15)

## 2022-05-20 LAB — ECG 12 LEAD
Atrial Rate: 88 {beats}/min
Calculated P Axis: 63 degrees
Calculated R Axis: 94 degrees
Calculated T Axis: 45 degrees
PR Interval: 150 ms
QRS Duration: 92 ms
QT Interval: 336 ms
QTC Calculation: 406 ms
Ventricular rate: 88 {beats}/min

## 2022-05-20 LAB — URINALYSIS, MICROSCOPIC
BACTERIA: NEGATIVE /hpf
RBCS: 3 /hpf (ref <4)
SQUAMOUS EPITHELIAL: 9 /hpf (ref <28)
WBCS: <1 /hpf (ref <6)

## 2022-05-20 LAB — URINALYSIS, MACROSCOPIC
BILIRUBIN: NEGATIVE mg/dL
BLOOD: NEGATIVE mg/dL
GLUCOSE: NEGATIVE mg/dL
KETONES: NEGATIVE mg/dL
LEUKOCYTES: NEGATIVE WBCs/uL
NITRITE: NEGATIVE
PH: 7.5 (ref 5.0–9.0)
PROTEIN: NEGATIVE mg/dL
SPECIFIC GRAVITY: 1.007 (ref 1.002–1.030)
UROBILINOGEN: NORMAL mg/dL

## 2022-05-20 LAB — MAGNESIUM: MAGNESIUM: 1.8 mg/dL — ABNORMAL LOW (ref 1.9–2.7)

## 2022-05-20 NOTE — ED Nurses Note (Signed)
Patient discharged home with family.  AVS reviewed with patient/care giver.  A written copy of the AVS and discharge instructions was given to the patient/care giver.  Questions sufficiently answered as needed.  Patient/care giver encouraged to follow up with PCP as indicated.  In the event of an emergency, patient/care giver instructed to call 911 or go to the nearest emergency room.

## 2022-05-20 NOTE — Discharge Instructions (Addendum)
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  I feel that you need to see a neurologist.  See a neurologist from the list provided below  Do not drive a vehicle and cleared by neurology.  If this is seizures, this can be deadly.  Return to the ER if there is any emergencies  Continue all your normal medications as prescribed by your doctor  Make sure that you are using progressive standing's as I described  Make sure you are drinking plenty of fluids    Evette Doffing [facility]  [facility]  Address: [place], [place], [place] [address]  Phone: [phone]    Mathis Fare, MD  Address: [address] [place], [place] [address]  Phone: [phone]    Cherly Hensen, MD  Address: [address], [place], [place] [address]  Phone: [phone]    Esther Hardy, MD  Address: [address], [place], [place] [address]  Phone: [phone]    [facility]  Address: [address], [place], [place] [address]  Phone: [phone]    Dr. Manon Hilding  Address: [address] [place], [place] [address]  Phone: [phone]    Orene Desanctis [facility]  Neurologist in [place], [place]  Address: [address] [place], [place] [address]  Phone: [phone]    Elizebeth Brooking MD  Address: [address], [place], [place] [address]  Phone: [phone]    Neurology & Siren MD  Address: [address] [place], [place], [place] [address]  Phone: [phone]    Dr. Blanca Friend  Address: [address], [place], [place] [address]  Phone: [phone]    [address]  Address: [address], [place], [place] [address]  Phone: [phone]    [facility]  Address: [address] [place], [place] [address]  Phone: [phone]    Nona Dell, [place]  Address: [address] [place], [place], [place] [address]  Phone: [phone]    [facility]  Address: [address] [place], [place],   [address]  Phone: [phone]    Robyn Haber, [place] [address]   [place] [address]   [phone]

## 2022-05-20 NOTE — ED Triage Notes (Signed)
States at 0430 this morning she went to go to the bathroom and had ringing in her ears and eyes went black with increased sweating, states that she passed out and fell hitting her head, states that her husband told her she was convulsing. States another episodes happened at 0630 this morning with same C/O. States this has happened before in August. States usually happens right when she gets up.

## 2022-05-20 NOTE — ED Provider Notes (Signed)
[facility]  ED Primary Provider Note  Patient Name: [name] [name]  Patient Age: 36 y.o.  Date of Birth: 1986-04-09    Chief Complaint: Syncope and Fall        History of Present Illness       [name] [name] is a 36 y.o. female who had concerns including Syncope and Fall.     HPI     Chief Complaint: Syncope    HPI:   Symptoms: The patient had 2 separate episodes of complete syncope.  Each 1 lasted less than 2 minutes.  The first 1 was witnessed by her husband and he stated that she had seizure-like activity.  She did hit her head on the toilet did have a complete loss of consciousness.  Location: Generalized  Radiation: None  Severity: Severe  Quality: Complete loss of consciousness  Duration: Less than 2-minute  Onset: Acute  Timing: The first episode was that 4:30 AM.  The second was at 6 AM  Context: The first episode occurred when she stood up to go to the bathroom.  The second occurred when she was already up and moving around getting ready for work.  Mechanism: Patient does have a history of the same situation in the past.  She has had referrals that are still in process.  See below  Additional Symptoms: The patient had no loss of bowel or bladder control.  The patient has complete amnesia to the event and immediately afterwards.  However she does remember the sensation of falling and losing her vision.  Patient has no injuries currently but she did hit her head.  She denies any neck or back pain.  Additional Information: The patient told me that she had a neurology appointment in [place].  However this was actually a neurosurgeon.  She has also been referred to cardiology.  The last time she was here she had a normal CT scan.  The neurosurgeon is ordering an MRI of the brain but it has not been scheduled yet.    Social History  Smoking: None  Alcohol: None  Ilic[employer] Drugs: None    Past Medical History:Reviewed and listed in the chart below.    Past Surgical History: Reviewed and  listed on the chart below.    Family History: Noncontributory.  No one in the family is ever had this problem before.  She does have a family history of coronary artery disease hypothyroidism and osteoarthritis    Allergies: Reviewed and listed on the chart    Medications: Reviewed and listed on the chart.    The nursing notes were reviewed and agreed upon unless otherwise stated    The vital signs were reviewed and are listed on the chart      PMHx:    Past Medical History:   Diagnosis Date    Anxiety     Depression     Esophageal reflux     Iron deficiency anemia, unspecified     Unspecified urinary incontinence     Weight loss due to medication     PSHx:   Past Surgical History:   Procedure Laterality Date    HX APPENDECTOMY      HX CHOLECYSTECTOMY      HX SALPINGECTOMY         Allergies:    Allergies   Allergen Reactions    Cefuroxime Axetil      Vomiting? Unsure to just po med.    Wellbutrin [Bupropion] Itching  Social History  Social History     Tobacco Use    Smoking status: Never    Smokeless tobacco: Never   Substance Use Topics    Alcohol use: Never       Family History  Family Medical History:       Problem Relation (Age of Onset)    Arthritis-osteo Father    Coronary Artery Disease Mother    Diabetes type II Maternal Grandmother    Hypothyroidism Sister               Home Meds:      Prior to Admission medications    Medication Sig Start Date End Date Taking? Authorizing Provider   cetirizine (ZYRTEC) 10 mg Oral Tablet Take 1 Tablet (10 mg total) by mouth Once a day for 90 days Indications: seasonal runny nose  Patient not taking: Reported on 03/21/2022 01/23/22 04/23/22  Harrup, Amy L, FNP   LORazepam (ATIVAN) 0.5 mg Oral Tablet Take 1 Tablet (0.5 mg total) by mouth Twice per day as needed for Anxiety 03/21/22   Alvis, Amy, PA-C   metroNIDAZOLE (FLAGYL) 500 mg Oral Tablet Take 1 Tablet (500 mg total) by mouth Three times a day 03/28/22   Alvis, Amy, PA-C   pantoprazole (PROTONIX) 40 mg Oral Tablet, Delayed  Release (E.C.) TAKE 1 TABLET by mouth ONCE DAILY 05/06/22   Alvis, Amy, PA-C   polysaccharide iron complex (FERREX 150) 150 mg iron Oral Capsule Take 1 Capsule (150 mg total) by mouth Twice daily    Provider, Historical   rosuvastatin (CRESTOR) 5 mg Oral Tablet Take 1 Tablet (5 mg total) by mouth Every evening 04/16/22   Alvis, Amy, PA-C   semaglutide, weight loss, (WEGOVY) 0.25 mg/0.5 mL Subcutaneous Pen Injector Inject 0.5 mL (0.25 mg total) under the skin Every 7 days for 30 days 03/21/22 04/20/22  Alvis, Amy, PA-C   solifenacin (VESICARE) 5 mg Oral Tablet Take 1 Tablet (5 mg total) by mouth Once a day    Provider, Historical   pantoprazole (PROTONIX) 40 mg Oral Tablet, Delayed Release (E.C.) TAKE 1 TABLET by mouth ONCE DAILY 03/25/22 05/06/22  Alvis, Amy, PA-C          ROS: No other overt Review of Systems are noted to be positive except noted in the HPI.        Physical Exam   ED Triage Vitals [05/20/22 0750]   BP (Non-Invasive) (!) 133/98   Heart Rate 95   Respiratory Rate 15   Temperature 36.4 C (97.5 F)   SpO2 100 %   Weight 104 kg (230 lb)   Height 1.6 m (_0 )         Physical exam  Constitutional/general:     Very pleasant well put together 36 year old female.  She is currently in no acute distress.  She appears to be uncomfortable but no severe pain.  HEENT: Eyes show normal extraocular movements.  Well-hydrated oral mucosa is noted.  There is no facial trauma or abnormalities.  Neck: No midline tenderness, no meningeal signs.  Full range of motion.  No JVD.  Cardiovascular: Heart is regular rate and rhythm S1-S2 sounds were auscultated without murmur click or rub  Respiratory: Lungs are clear to auscultation in all fields without wheeze rale or rhonchi.  GI: Abdomen is soft non tender normal bowel sounds are auscultated.  There is no rebound tenderness or guarding.  Obese abdomen BMI 41  Neuro cranial nerves II through XII are intact and  normal.  Patient has normal speech and normal gait.  There is no  muscle weakness in any extremities.  Sensation is intact throughout.  DTRs +2/4 in all extremities.  There is no facial droop.  She speaks in full complete sentences and expresses herself well  Psych: Patient is alert and oriented person place and time.  Patient is very pleasant converse with has a euthymic affect.  There are no signs of depression or anxiety.  Skin: No rash.  No petechiae or purpura.  The skin is warm and dry without diaphoresis.  There is no pallor.  Musculoskeletal: There is no tenderness to palpation in any body region.  There is no lower extremity edema and no calf tenderness.  Negative Homans sign bilaterally.  GU: Deferred      Procedures      Patient Data     Labs Ordered/Reviewed   COMPREHENSIVE METABOLIC PANEL, NON-FASTING - Abnormal; Notable for the following components:       Result Value    ALBUMIN/GLOBULIN RATIO 1.6 (*)     GLOBULIN 2.6 (*)     All other components within normal limits    Narrative:     Estimated Glomerular Filtration Rate (eGFR) is calculated using the CKD-EPI (2021) equation, intended for patients 76 years of age and older. If gender is not documented or "unknown", there will be no eGFR calculation.   MAGNESIUM - Abnormal; Notable for the following components:    MAGNESIUM 1.8 (*)     All other components within normal limits   URINALYSIS, MICROSCOPIC - Abnormal; Notable for the following components:    MUCOUS Rare (*)     All other components within normal limits   TROPONIN-I - Normal   URINALYSIS, MACROSCOPIC - Normal   ADULT ROUTINE BLOOD CULTURE, SET OF 2 BOTTLES (BACTERIA AND YEAST)   ADULT ROUTINE BLOOD CULTURE, SET OF 2 BOTTLES (BACTERIA AND YEAST)   URINALYSIS, MACROSCOPIC AND MICROSCOPIC W/CULTURE REFLEX    Narrative:     The following orders were created for panel order URINALYSIS, MACROSCOPIC AND MICROSCOPIC W/CULTURE REFLEX.  Procedure                               Abnormality         Status                     ---------                                -----------         ------                     URINALYSIS, MACROSCOPIC[[id number]]      Normal              Final result               URINALYSIS, MICROSCOPIC[[id number]]      Abnormal            Final result                 Please view results for these tests on the individual orders.       MRI BRAIN WO CONTRAST   Final Result by Edi, Radresults In (09/25 1208)   1. NO ACUTE FINDINGS   2. UNREMARKABLE NONCONTRAST MRI THE BRAIN.  THERE IS A LEFT MIDDLE CRANIAL FOSSA ARACHNOID CYST FELT TO BE OF NO CLINICAL SIGNIFICANCE AND STABLE SINCE 12/28/2017            Radiologist location ID: [id number]         CT BRAIN WO IV CONTRAST   Final Result by Edi, Radresults In (09/25 1001)   NO ACUTE FINDINGS         One or more dose reduction techniques were used (e.g., Automated exposure control, adjustment of the mA and/or kV according to patient size, use of iterative reconstruction technique).         Radiologist location ID: [place] Decision Making  Consultation: Spoke with the neurosurgeons staff.  They informed me that he was requesting a MRI without contrast.    Problems Addressed:  Seizure-like activity (CMS [place]): acute illness or injury  Syncope, unspecified syncope type: acute illness or injury    Amount and/or Complexity of Data Reviewed  Independent Historian: parent and spouse  Labs: ordered. Decision-making details documented in ED Course.  Radiology: ordered. Decision-making details documented in ED Course.  ECG/medicine tests: ordered and independent interpretation performed. Decision-making details documented in ED Course.     Details: EKG is interpreted by me at 9:03 AM.  It will be interpreted by cardiologist at a later time.  Axis: Vertical.  Normal R-wave progression across the anterior precordial leads.  Normal PR and QT intervals are noted.  There are no ST elevations or depressions.  No Q waves noted.  Normal sinus rhythm at 90 bpm                                        Clinical Impression   Syncope, unspecified syncope type (Primary)   Seizure-like activity (CMS [facility])           Discharged      Discharge Medication List as of 05/20/2022  1:28 PM                  _______________________________  Darryll Capers, D.O.  Emergency Medicine  [place]          This note may have been partially generated using MModal Fluency Direct system, and there may be some incorrect words, spellings, and punctuation that were not noted in checking the note before saving, though effort was made to avoid such errors.

## 2022-05-21 ENCOUNTER — Other Ambulatory Visit (INDEPENDENT_AMBULATORY_CARE_PROVIDER_SITE_OTHER): Payer: Self-pay | Admitting: Physician Assistant

## 2022-05-21 ENCOUNTER — Telehealth (INDEPENDENT_AMBULATORY_CARE_PROVIDER_SITE_OTHER): Payer: Self-pay | Admitting: Physician Assistant

## 2022-05-21 NOTE — Telephone Encounter (Addendum)
No answer, Left message

## 2022-05-21 NOTE — Telephone Encounter (Signed)
Patient called back. She stated she is improving. Will come to her follow appointment.

## 2022-05-23 ENCOUNTER — Ambulatory Visit: Payer: Managed Care, Other (non HMO) | Attending: Physician Assistant | Admitting: Physician Assistant

## 2022-05-23 ENCOUNTER — Other Ambulatory Visit: Payer: Self-pay

## 2022-05-23 ENCOUNTER — Encounter (INDEPENDENT_AMBULATORY_CARE_PROVIDER_SITE_OTHER): Payer: Self-pay | Admitting: Physician Assistant

## 2022-05-23 VITALS — BP 112/78 | HR 88 | Temp 98.8°F | Ht 63.0 in | Wt 230.0 lb

## 2022-05-23 DIAGNOSIS — F419 Anxiety disorder, unspecified: Secondary | ICD-10-CM | POA: Insufficient documentation

## 2022-05-23 DIAGNOSIS — F32A Depression, unspecified: Secondary | ICD-10-CM | POA: Insufficient documentation

## 2022-05-23 DIAGNOSIS — K219 Gastro-esophageal reflux disease without esophagitis: Secondary | ICD-10-CM | POA: Insufficient documentation

## 2022-05-23 DIAGNOSIS — R55 Syncope and collapse: Secondary | ICD-10-CM | POA: Insufficient documentation

## 2022-05-23 DIAGNOSIS — Z09 Encounter for follow-up examination after completed treatment for conditions other than malignant neoplasm: Secondary | ICD-10-CM

## 2022-05-23 DIAGNOSIS — R569 Unspecified convulsions: Secondary | ICD-10-CM | POA: Insufficient documentation

## 2022-05-23 LAB — ADULT ROUTINE BLOOD CULTURE, SET OF 2 BOTTLES (BACTERIA AND YEAST)
BLOOD CULTURE, ROUTINE: NO GROWTH
BLOOD CULTURE, ROUTINE: NO GROWTH

## 2022-05-23 MED ORDER — SOLIFENACIN 5 MG TABLET
5.0000 mg | ORAL_TABLET | Freq: Every day | ORAL | 2 refills | Status: DC
Start: 2022-05-23 — End: 2022-09-02

## 2022-05-23 MED ORDER — LORAZEPAM 0.5 MG TABLET
0.5000 mg | ORAL_TABLET | Freq: Two times a day (BID) | ORAL | 2 refills | Status: DC | PRN
Start: 2022-05-23 — End: 2022-09-05

## 2022-05-23 MED ORDER — PHENTERMINE 37.5 MG TABLET
37.5000 mg | ORAL_TABLET | Freq: Every morning | ORAL | 2 refills | Status: DC
Start: 2022-05-23 — End: 2022-09-05

## 2022-05-23 MED ORDER — PANTOPRAZOLE 40 MG TABLET,DELAYED RELEASE
40.0000 mg | DELAYED_RELEASE_TABLET | Freq: Every day | ORAL | 2 refills | Status: DC
Start: 2022-05-23 — End: 2022-09-05

## 2022-05-23 NOTE — Nursing Note (Signed)
05/23/22 1500   Orthostatic Vitals Set #1   Time 1556   Initials KD   Patient Position Supine   Heart Rate 80   Blood Pressure 110/82   BP Source Manual;Left Arm   O2 SAT 98   Orthostatic Vitals Set #2   Time 1559   Initials KD   Patient Position Sitting   Heart Rate 90   Blood Pressure 102/78   BP Source  Manual;Left Arm   O2 SAT 96   Orthostatic Vitals Set #3   Time 1600   Initials KD   Patient Position Sitting   Heart Rate 92   Blood Pressure 102/68   BP Source  Left Arm;Manual   O2 SAT 100

## 2022-05-23 NOTE — Nursing Note (Signed)
Patient is here for ER follow up, but it is also time for her routine 3 month follow up as well and she is needing medication refills at this time.

## 2022-05-23 NOTE — Progress Notes (Signed)
INTERNAL MEDICINE, BUILDING A  [address]  [place] [address]          Follow Up        Reason for Visit: ED Follow-up med refills    History of Present Illness  [name] [name] is a 36 y.o. female who is being seen today in office for ER f/u after syncopal episode noted on 9/25.  Patient states she got up out of bed early that morning when her husband was going to work around 4:30 a.m.Marland Kitchen  She walked to the bathroom at which time she urinated went to stand up and passed out.  Patient states that her husband found her in the floor and then assisted her back to the bedroom.  Later on upon getting back up in order to get ready for work had a recurrent episode and witnessed possible seizure activity.  At that time her mother who was with her to go to the emergency room for further workup.  ER note below as noted:    [facility]  ED Primary Provider Note  Patient Name: [name] [name]  Patient Age: 36 y.o.  Date of Birth: [birth year]     Chief Complaint: Syncope and Fall              History of Present Illness   [name] [name] is a 36 y.o. female who had concerns including Syncope and Fall.     Chief Complaint: Syncope  HPI:   Symptoms: The patient had 2 separate episodes of complete syncope.  Each 1 lasted less than 2 minutes.  The first 1 was witnessed by her husband and he stated that she had seizure-like activity.  She did hit her head on the toilet did have a complete loss of consciousness  Clinical Impression   Syncope, unspecified syncope type (Primary)   Seizure-like activity (CMS HCC)      Urine neg    CT head unremarkable  MRI brain      IMPRESSION:  1. NO ACUTE FINDINGS  2. UNREMARKABLE NONCONTRAST MRI THE BRAIN. 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 Click to view the full report.      Patient states since the episode Monday she feels  completely fine.  Emergency room did check labs showed her magnesium slightly low at 1.8 she denies any cramping.  They also consulted her neurosurgeon in [place] due to history of arachnoid cyst however after his review he did not feel like any for symptoms related.  It was noted on the MRI of the CS had showed no change since 2019.  Patient did have 3 protein drinks that day and denied caffeine intake.  She has been referred to the neurologist Dr. Sedalia Muta in [place] but states he appointment is not until the end of December.  She is also called several other neurologist locally including [place] who are unable to see her into the end of the year or 1st 2024.  Questioning if there is any by also can get her in sooner with.    Is noted patient has been taking Adipex for weight loss however no previous issues with medication noted and she states the ER physician did not feel like it was related.  She would also recently completed a 3 day Holter which showed atrial PACs and PVCs  but no tachycardia.    Follow-up anxiety/depression with patient currently using Ativan p.r.n..  Currently symptoms stable at this time on dosing and request for medication refills noted.    Follow-up GERD currently on Protonix 40 mg daily.  Patient has no GI issues at this current time.  She still continues take the iron also for iron deficiency with no GI side effects.    PHQ Questionnaire  Little interest or pleasure in doing things.: Not at all  Feeling down, depressed, or hopeless: Not at all  PHQ 2 Total: 0  Trouble falling or staying asleep, or sleeping too much.: Not at all  Feeling tired or having little energy: Not at all  Poor appetite or overeating: Not at all  Feeling bad about yourself/ that you are a failure in the past 2 weeks?: Not at all  Trouble concentrating on things in the past 2 weeks?: Not at all  Moving/Speaking slowly or being fidgety or restless  in the past 2 weeks?: Not at all  Thoughts that you would be better off  DEAD, or of hurting yourself in some way.: Not at all  PHQ 9 Total: 0  Interpretation of Total Score: 0-4 No depression      Past Medical History:   Diagnosis Date    Anxiety     Depression     Esophageal reflux     Iron deficiency anemia, unspecified     Unspecified urinary incontinence     Weight loss due to medication          Past Surgical History:   Procedure Laterality Date    HX APPENDECTOMY      HX CHOLECYSTECTOMY      HX SALPINGECTOMY        Family Medical History:       Problem Relation (Age of Onset)    Arthritis-osteo Father    Coronary Artery Disease Mother    Diabetes type II Maternal Grandmother    Hypothyroidism Sister            Social History     Tobacco Use    Smoking status: Never    Smokeless tobacco: Never   Substance Use Topics    Alcohol use: Never     Medication:  polysaccharide iron complex (FERREX 150) 150 mg iron Oral Capsule, Take 1 Capsule (150 mg total) by mouth Once a day  cetirizine (ZYRTEC) 10 mg Oral Tablet, Take 1 Tablet (10 mg total) by mouth Once a day for 90 days Indications: seasonal runny nose (Patient not taking: Reported on 03/21/2022)  LORazepam (ATIVAN) 0.5 mg Oral Tablet, Take 1 Tablet (0.5 mg total) by mouth Twice per day as needed for Anxiety  metroNIDAZOLE (FLAGYL) 500 mg Oral Tablet, Take 1 Tablet (500 mg total) by mouth Three times a day  pantoprazole (PROTONIX) 40 mg Oral Tablet, Delayed Release (E.C.), TAKE 1 TABLET by mouth ONCE DAILY  phentermine (ADIPEX-P) 37.5 mg Oral Tablet, Take 1 Tablet (37.5 mg total) by mouth Every morning before breakfast  rosuvastatin (CRESTOR) 5 mg Oral Tablet, Take 1 Tablet (5 mg total) by mouth Every evening  semaglutide, weight loss, (WEGOVY) 0.25 mg/0.5 mL Subcutaneous Pen Injector, Inject 0.5 mL (0.25 mg total) under the skin Every 7 days for 30 days  solifenacin (VESICARE) 5 mg Oral Tablet, Take 1 Tablet (5 mg total) by mouth Once a day    No facility-administered medications prior to visit.    Allergies:  Allergies   Allergen  Reactions  Cefuroxime Axetil      Vomiting? Unsure to just po med.    Wellbutrin [Bupropion] Itching       Physical Exam:  Vitals:    05/23/22 1525   BP: 112/78   Pulse: 88   Temp: 37.1 C (98.8 F)   TempSrc: Tympanic   SpO2: 100%   Weight: 104 kg (230 lb)   Height: 1.6 m (5\' 3" )   BMI: 40.83      Physical Exam  Vitals and nursing note reviewed.   Constitutional:       Appearance: Normal appearance. She is obese.   HENT:      Right Ear: Tympanic membrane normal.      Left Ear: Tympanic membrane normal.      Mouth/Throat:      Mouth: Mucous membranes are moist.      Pharynx: Oropharynx is clear.   Eyes:      Pupils: Pupils are equal, round, and reactive to light.   Cardiovascular:      Rate and Rhythm: Normal rate and regular rhythm.      Pulses: Normal pulses.   Pulmonary:      Effort: Pulmonary effort is normal.      Breath sounds: Normal breath sounds.   Abdominal:      General: Bowel sounds are normal.      Palpations: Abdomen is soft.      Tenderness: There is no abdominal tenderness.   Musculoskeletal:         General: No swelling or tenderness. Normal range of motion.      Cervical back: Normal range of motion and neck supple.   Skin:     General: Skin is warm.      Findings: No lesion or rash.   Neurological:      General: No focal deficit present.      Mental Status: She is alert and oriented to person, place, and time.      Sensory: No sensory deficit.      Motor: No weakness.      Gait: Gait normal.   Psychiatric:         Mood and Affect: Mood normal.         Behavior: Behavior normal.         Thought Content: Thought content normal.         Judgment: Judgment normal.       Orthostatic Vitals    Time: 1556  Patient Position: Supine  Heart Rate: 80  Blood Pressure: 110/82  BP Source: Manual, Left Arm  O2 SAT: 98 Time: 1559  Patient Position: Sitting  Heart Rate: 90  Blood Pressure: 102/78  BP Source : Manual, Left Arm  O2 SAT: 96 Time: 1600  Patient Position: Sitting  Heart Rate: 92  Blood Pressure:  102/68  BP Source : Left Arm, Manual  O2 SAT: 100         Ingvald Theisen, PA-C 05/23/2022, 16:09   Assessment/Plan:  Problem List Items Addressed This Visit          Digestive    Esophageal reflux       Psychiatric    Depression    Anxiety     Other Visit Diagnoses       Syncope, unspecified syncope type    -  Primary    Relevant Orders    Referral to External Provider (AMB)    POCT ORTHOSTATIC BLOOD PRESSUE & PULSE    Seizure-like activity (CMS HCC)  Hypomagnesemia               Orthostatic BP check neg  Encouraged to make movements very slowly especially standing from lying or seated position  Continue proper hydration electrolytes daily  Will increase magnesium in diet  Will attempt to get a sooner appointment with local neurologist  Meds refilled as noted  Follow-up as noted       Follow up:   Post-Discharge Follow Up Appointments       Thursday Jun 20, 2022    Appointment with Adline Mango, MD at 12:15 PM      Tuesday Jun 25, 2022    Return Patient Visit with Eyvonne Mechanic, PA-C at  9:00 AM      Friday Jul 26, 2022    New Patient Visit with Dyke Brackett, DO at 11:00 AM      Thursday Aug 29, 2022    Appointment with Eyvonne Mechanic, PA-C at  9:30 AM      Internal Medicine, Building A  [facility]  [address]  [place] [address]  [phone] Neurology, [facility]  [facility] [place]  [address]  [place] [address]  [phone]           Seek medical attention for new or worsening symptoms.  Patient has been seen in this clinic within the last 3 years.     Petra Dumler, PA-C          This note was partially created using MModal Fluency Direct system (voice recognition software) and is inherently subject to errors including those of syntax and "sound-alike" substitutions which may escape proofreading.  In such instances, original meaning may be extrapolated by contextual derivation.

## 2022-05-30 ENCOUNTER — Ambulatory Visit (INDEPENDENT_AMBULATORY_CARE_PROVIDER_SITE_OTHER): Payer: Self-pay | Admitting: Physician Assistant

## 2022-05-30 ENCOUNTER — Encounter (INDEPENDENT_AMBULATORY_CARE_PROVIDER_SITE_OTHER): Payer: Self-pay

## 2022-05-30 DIAGNOSIS — R55 Syncope and collapse: Secondary | ICD-10-CM | POA: Insufficient documentation

## 2022-05-31 ENCOUNTER — Other Ambulatory Visit: Payer: Self-pay

## 2022-05-31 ENCOUNTER — Other Ambulatory Visit (HOSPITAL_COMMUNITY): Payer: Managed Care, Other (non HMO) | Admitting: Physician Assistant

## 2022-05-31 ENCOUNTER — Other Ambulatory Visit: Payer: Managed Care, Other (non HMO) | Attending: Physician Assistant

## 2022-05-31 ENCOUNTER — Ambulatory Visit (INDEPENDENT_AMBULATORY_CARE_PROVIDER_SITE_OTHER): Payer: Managed Care, Other (non HMO) | Admitting: NEUROLOGY

## 2022-05-31 ENCOUNTER — Encounter (INDEPENDENT_AMBULATORY_CARE_PROVIDER_SITE_OTHER): Payer: Self-pay | Admitting: NEUROLOGY

## 2022-05-31 VITALS — BP 127/71 | HR 79 | Temp 98.2°F | Ht 63.0 in | Wt 227.4 lb

## 2022-05-31 DIAGNOSIS — R809 Proteinuria, unspecified: Secondary | ICD-10-CM | POA: Insufficient documentation

## 2022-05-31 DIAGNOSIS — E782 Mixed hyperlipidemia: Secondary | ICD-10-CM | POA: Insufficient documentation

## 2022-05-31 DIAGNOSIS — G909 Disorder of the autonomic nervous system, unspecified: Secondary | ICD-10-CM | POA: Insufficient documentation

## 2022-05-31 DIAGNOSIS — R55 Syncope and collapse: Secondary | ICD-10-CM | POA: Insufficient documentation

## 2022-05-31 LAB — MAGNESIUM: MAGNESIUM: 2.1 mg/dL (ref 1.9–2.7)

## 2022-05-31 LAB — URINALYSIS, MACROSCOPIC
BILIRUBIN: NEGATIVE mg/dL
BLOOD: NEGATIVE mg/dL
GLUCOSE: NEGATIVE mg/dL
KETONES: NEGATIVE mg/dL
LEUKOCYTES: NEGATIVE WBCs/uL
NITRITE: NEGATIVE
PH: 7.5 (ref 5.0–9.0)
PROTEIN: NEGATIVE mg/dL
SPECIFIC GRAVITY: 1.018 (ref 1.002–1.030)
UROBILINOGEN: NORMAL mg/dL

## 2022-05-31 LAB — COMPREHENSIVE METABOLIC PNL, FASTING
ALBUMIN/GLOBULIN RATIO: 1.5 — ABNORMAL HIGH (ref 0.8–1.4)
ALBUMIN: 4.3 g/dL (ref 3.5–5.7)
ALKALINE PHOSPHATASE: 95 U/L (ref 34–104)
ALT (SGPT): 14 U/L (ref 7–52)
ANION GAP: 7 mmol/L (ref 4–13)
AST (SGOT): 18 U/L (ref 13–39)
BILIRUBIN TOTAL: 0.4 mg/dL (ref 0.3–1.2)
BUN/CREA RATIO: 11 (ref 6–22)
BUN: 9 mg/dL (ref 7–25)
CALCIUM, CORRECTED: 9 mg/dL (ref 8.9–10.8)
CALCIUM: 9.3 mg/dL (ref 8.6–10.3)
CHLORIDE: 106 mmol/L (ref 98–107)
CO2 TOTAL: 25 mmol/L (ref 21–31)
CREATININE: 0.81 mg/dL (ref 0.60–1.30)
ESTIMATED GFR: 98 mL/min/{1.73_m2} (ref >59)
GLOBULIN: 2.9 (ref 2.9–5.4)
GLUCOSE: 83 mg/dL (ref 74–109)
OSMOLALITY, CALCULATED: 274 mosm/kg (ref 270–290)
POTASSIUM: 3.8 mmol/L (ref 3.5–5.1)
PROTEIN TOTAL: 7.2 g/dL (ref 6.4–8.9)
SODIUM: 138 mmol/L (ref 136–145)

## 2022-05-31 LAB — CBC WITH DIFF
BASOPHIL #: 0 10*3/uL (ref 0.00–0.10)
BASOPHIL %: 1 % (ref 0–1)
EOSINOPHIL #: 0 10*3/uL (ref 0.00–0.50)
EOSINOPHIL %: 1 %
HCT: 42.5 % — ABNORMAL HIGH (ref 31.2–41.9)
HGB: 14.4 g/dL — ABNORMAL HIGH (ref 10.9–14.3)
LYMPHOCYTE #: 1.5 10*3/uL (ref 1.00–3.00)
LYMPHOCYTE %: 28 % (ref 16–44)
MCH: 28.6 pg (ref 24.7–32.8)
MCHC: 34 g/dL (ref 32.3–35.6)
MCV: 84.1 fL (ref 75.5–95.3)
MONOCYTE #: 0.4 10*3/uL (ref 0.30–1.00)
MONOCYTE %: 7 % (ref 5–13)
MPV: 7.9 fL (ref 7.9–10.8)
NEUTROPHIL #: 3.4 10*3/uL (ref 1.85–7.80)
NEUTROPHIL %: 64 % (ref 43–77)
PLATELETS: 331 10*3/uL (ref 140–440)
RBC: 5.06 10*6/uL — ABNORMAL HIGH (ref 3.63–4.92)
RDW: 13.6 % (ref 12.3–17.7)
WBC: 5.3 10*3/uL (ref 3.8–11.8)

## 2022-05-31 LAB — C-REACTIVE PROTEIN (CRP): C-REACTIVE PROTEIN (CRP): 1.1 mg/dL — ABNORMAL HIGH (ref 0.1–0.5)

## 2022-05-31 LAB — LIPID PANEL
CHOL/HDL RATIO: 4.7
CHOLESTEROL: 173 mg/dL (ref <200)
HDL CHOL: 37 mg/dL (ref 23–92)
LDL CALC: 118 mg/dL — ABNORMAL HIGH (ref 0–100)
TRIGLYCERIDES: 92 mg/dL (ref <=150)
VLDL CALC: 18 mg/dL (ref 0–50)

## 2022-05-31 LAB — URINALYSIS, MICROSCOPIC
RBCS: 9 /hpf — ABNORMAL HIGH (ref <4)
SQUAMOUS EPITHELIAL: 9 /hpf (ref <28)
WBCS: <1 /hpf (ref <6)

## 2022-05-31 LAB — HGA1C (HEMOGLOBIN A1C WITH EST AVG GLUCOSE): HEMOGLOBIN A1C: 5.3 % (ref 4.0–6.0)

## 2022-05-31 LAB — SEDIMENTATION RATE: ERYTHROCYTE SEDIMENTATION RATE (ESR): 20 mm/hr — ABNORMAL HIGH (ref <20)

## 2022-05-31 LAB — THYROID STIMULATING HORMONE WITH FREE T4 REFLEX: TSH: 2.23 u[IU]/mL (ref 0.450–5.330)

## 2022-05-31 LAB — VITAMIN B12: VITAMIN B 12: 489 pg/mL (ref 180–914)

## 2022-05-31 NOTE — Progress Notes (Signed)
ASSESSMENT  1. Recurrent syncope:  She is a 36 year old woman who is referred for recurrent episodes of syncope.  Her neurologic exam is notable for decreased vibratory sense in the left foot but otherwise normal.  By description, there was nothing concerning about these episodes for seizure activity.  They often occur in instances which would be consistent with a vasovagal syncope.  She has had some shaking activity though this is much more likely to be convulsive syncope rather than seizure activity.  She often remains at least somewhat aware of her surroundings during the events.  She does have an MRI which shows a left middle cranial fossa cyst.  This is not changed from prior imaging.  I believe this is likely noncontributory to her symptoms.  The larger question is why is she having so many syncopal episodes.  She does have some autonomic dysfunction based on history, but she reports having orthostatic vitals recently that were normal.  A main concern at this point would be for a more systemic dysautonomia.  I will obtain an EMG to assess for any signs of neuropathy.  I will consider autonomic testing with that.  Would also like to obtain rheumatologic screening labs for potential autoimmune etiology of dysautonomia.  Would also consider hypoglycemic events as well as additional cardiac etiologies.  She has had a Holter monitor though has not capture an event of concern.      PLAN  Obtain ANA, rheumatoid factor, anti CCP antibody, ESR/CRP, Sjogren's antibodies        Obtain B12 and A1c        Obtain NCS/EMG    We will determine further follow-up at her EMG appointment    Thank you for allowing me to participate in your patient's care and please do not hesitate to contact me for any questions or concerns.    Adrian Prows, DO  Assistant Professor of Neurology  [facility]     I personally spent a total of 60 minutes today preparing to see the patient, in the encounter  with the patient, and documenting after the visit.    ==========================================================================================================================================    NAME:  [name] [name]  DOB:  03/08/1986  VISIT DATE:  05/31/2022    CC:  Loss of consciousness event    Patient seen in consultation as follow-up from emergency room visit  History obtained from the patient and chart/records  Age of patient:  36 y.o.      HPI:   I had the pleasure of seeing your patient in neurology clinic for an outpatient consultation, who is a 36 y.o. year old female who was referred for evaluation of recurrent loss of consciousness events.  Please allow me to summarize the history for the record.    She states that she is had these issues for quite some time dating back to when she was 68 to 36 years old.  They are typically characterized by her getting lightheaded followed by vision going black and then she will lose consciousness.  She will at times have ear ringing as well and some vertigo.  She has occasionally had shaking associated with these events.  She usually is aware to some degree and can recall people talking to her though could not reply or follow the commands.  These episodes have happened in a variety of instances.  Her most recent episode occurred after using the bathroom in standing up from the toilet.  She then had another event  at the hospital while they were drawing blood.  She has also had episodes in the setting of significant pain.  Her most recent medication change was the addition of phentermine for weight loss.    Autonomic symptoms  --constipation:  none  --early satiety: yes  --urinary problems:  since 2nd child  --orthostasis:  intermittently  --loss of consciousness:  yes  --palpitations:  no      ============================================================================================================================================  PMHx  Patient Active Problem List    Diagnosis    Depression    Iron deficiency anemia, unspecified    Weight loss due to medication    Anxiety    Unspecified urinary incontinence    Esophageal reflux    Elevated triglycerides with high cholesterol    Abnormal glucose    Chronic fatigue    Syncope     Past Surgical History:   Procedure Laterality Date    HX APPENDECTOMY      HX CHOLECYSTECTOMY      HX SALPINGECTOMY           Family Medical History:       Problem Relation (Age of Onset)    Arthritis-osteo Father    Coronary Artery Disease Mother    Diabetes type II Maternal Grandmother    Hypothyroidism Sister            Current Outpatient Medications   Medication Sig Dispense Refill    LORazepam (ATIVAN) 0.5 mg Oral Tablet Take 1 Tablet (0.5 mg total) by mouth Twice per day as needed for Anxiety 60 Tablet 2    pantoprazole (PROTONIX) 40 mg Oral Tablet, Delayed Release (E.C.) Take 1 Tablet (40 mg total) by mouth Once a day 30 Tablet 2    phentermine (ADIPEX-P) 37.5 mg Oral Tablet Take 1 Tablet (37.5 mg total) by mouth Every morning before breakfast 30 Tablet 2    polysaccharide iron complex (FERREX 150) 150 mg iron Oral Capsule Take 1 Capsule (150 mg total) by mouth Once a day      solifenacin (VESICARE) 5 mg Oral Tablet Take 1 Tablet (5 mg total) by mouth Once a day 30 Tablet 2     No current facility-administered medications for this visit.     Allergies   Allergen Reactions    Cefuroxime Axetil      Vomiting? Unsure to just po med.    Wellbutrin [Bupropion] Itching     Social History     Socioeconomic History    Marital status: Married     Spouse name: Not on file    Number of children: Not on file    Years of education: Not on file    Highest education level: Not on file   Occupational History    Not on file   Tobacco Use    Smoking status: Never    Smokeless tobacco: Never   Vaping Use    Vaping Use: Never used   Substance and Sexual Activity    Alcohol use: Never    Drug use: Never    Sexual activity: Not on file   Other Topics Concern    Not on  file   Social History Narrative    Not on file     Social Determinants of Health     Financial Resource Strain: Not on file   Transportation Needs: Not on file   Social Connections: Not on file   Intimate Partner Violence: Not on file   Housing Stability: Not on file       ============================================================================================================================================  GENERAL EXAMINATION  BP 127/71 (Site: Left, Patient Position: Sitting, Cuff Size: Adult Large)   Pulse 79   Temp 36.8 C (98.2 F) (Temporal)   Ht 1.6 m (_0 )   Wt 103 kg (227 lb 6.4 oz)   LMP 04/19/2022 (Approximate) Comment: has had tubal  SpO2 97%   BMI 40.28 kg/m     Vital signs personally reviewed  General: No acute distress, alert  HEENT: Normocephalic, no scleral icterus  Pulmonary: No accessory muscle use, no tachypnea  Cardiovascular: Heart with regular rate & rhythm  Extremities: No significant edema, No cyanosis    NEUROLOGIC EXAM  On neurological exam, patient was awake, alert and answering questions appropriately  Speech was fluent, without dysarthria or aphasia.    CN  II: not directly tested, grossly intact  III, IV, VI: extraocular movements intact without nystagmus  V: intact to light touch  VII: face symmetric without weakness  VIII: grossly intact  IX, X: symmetric palatal elevation  XI: normal strength of trapezius and sternocleidomastoid bilaterally  XII: tongue midline with full movements    MOTOR  Bulk: normal  Tone: normal  Abnormal Movements: none    Strength:     MRC Grading Scale   Right Left   Deltoid 5 5   Biceps 5 5   Triceps 5 5   Wrist Extension 5 5   Wrist Flexion - -   Finger Extension 5 5   Finger Abduction 5 5   Finger Flexion 5 5   Hip Flexion 5 5   Hip Extension - -   Hip Abduction - -   Hip Adduction - -   Knee Extension 5 5   Knee Flexion 5 5   Ankle Dorsiflexion 5 5   Ankle Plantarflexion 5 5   Toe Extension 5 5   Toe Flexion - -     REFLEXES   Right Left    Biceps 2 2   Triceps 2 2   Brachioradialis 2 2   Patellar 3 3   Achilles 2 2   Plantar - -   Hoffman - -   Pectoralis - -   Jaw Jerk - -       SENSORY  Pin: intact throughout  Vibration: moderately diminished in the left foot    GAIT  General: casual, normal gait  Toe Walk: normal  Heel Walk: normal  Tandem: normal    COORDINATION  Finger nose finger: normal  Heel to shin: normal    ================================================================================================================================LABS  Personal Review of prior labs is notable for:   2023  CMP largely within normal limits   TSH within normal limits  CBC largely within normal limits  A1c 5.4  IMAGING  Personal Review of imaging is notable for:   MRI of the brain without contrast May 20, 2022 notable for a left middle cranial fossa mass likely arachnoid cyst    OTHER DIAGNOSTICS  Personal Review of other prior diagnostics is notable for:  Not applicable

## 2022-06-01 LAB — RHEUMATOID FACTOR, SERUM: RHEUMATOID FACTOR: <13 IU/mL (ref <=30)

## 2022-06-03 LAB — SS-A/RO ANTIBODIES, IGG, SERUM: SS-A/RO, ANTIBODIES, IGG QUALITATIVE: NEGATIVE

## 2022-06-03 LAB — SS-B/LA ANTIBODIES, IGG, SERUM: SS-B/LA ANTIBODIES, IGG, QUALITATIVE: NEGATIVE

## 2022-06-03 LAB — CYCLIC CITRULLINATED PEPTIDE ANTIBODIES, IGG, SERUM
CYCLIC CITRULLINATED PEPTIDE ANTIBODY IGG QUAL: NEGATIVE
CYCLIC CITRULLINATED PEPTIDE ANTIBODY IGG QUANT: <0.5 U/mL (ref <3.0)

## 2022-06-03 LAB — HEP-2 SUBSTRATE ANTINUCLEAR ANTIBODIES (ANA), SERUM: ANA INTERPRETATION: NEGATIVE

## 2022-06-04 ENCOUNTER — Other Ambulatory Visit (INDEPENDENT_AMBULATORY_CARE_PROVIDER_SITE_OTHER): Payer: Self-pay | Admitting: Physician Assistant

## 2022-06-04 LAB — PROTEINASE 3 ANTIBODIES, IGG, SERUM
PROTEINASE ANTIBODIES IGG, QUALITATIVE: NEGATIVE
PROTEINASE ANTIBODIES IGG, QUANTITATIVE: <0.2 [AU]/ml (ref <1.0)

## 2022-06-04 LAB — MYELOPEROXIDASE ANTIBODIES, IGG, SERUM
MYELOPEROXIDASE ANTIBODIES IGG QUALITATIVE: NEGATIVE
MYELOPEROXIDASE ANTIBODIES IGG QUANTITATIVE: <0.2 [AU]/ml (ref <1.0)

## 2022-06-04 LAB — URINE CULTURE: URINE CULTURE: 10000 — AB

## 2022-06-04 MED ORDER — FLUCONAZOLE 150 MG TABLET
ORAL_TABLET | ORAL | 0 refills | Status: DC
Start: 2022-06-04 — End: 2022-09-05

## 2022-06-13 NOTE — Progress Notes (Signed)
Cranial Note:      HPI: [name] [name] is a 36 y.o. female patient who  Has episodes where she blacks out, loses vision and loses consciousness.  Going on for 10 years.  She reports convulscing during these episodes.  Seen by neurology 9 years ago, was told he didn't have enough blood but was told she had a cyst behind her eye.  Takes Fe for anemia.  Takes protein drink which keeps these episodes from happening, but will have these episodes if she doesn't take her shake.  Is wearing heart monitor, results came back today - NSR, some pvc    06/13/22:  Called today regarding MR.  Has neurologist in [place] currently working her up for her previous sx.  No further problems since her last episode       No chief complaint on file.  .      Past Medical History:   Diagnosis Date   . Depression     post partum     Past Surgical History:   Procedure Laterality Date   . HX OTHER SURGICAL HISTORY      exploratory lap.   . LAP,CHOLECYSTECTOMY N/A 05/17/2015    Procedure: CHOLECYSTECTOMY, LAPAROSCOPIC;  Surgeon: Paget, Karrie Meres, MD;  Location: [facility] MAIN OR     Ceftin [cefuroxime axetil]    Current Outpatient Medications:   .  metroNIDAZOLE (FLAGYL) 500 mg Tablet, take 500 mg by mouth ., Disp: , Rfl:   .  solifenacin (VESICARE) 5 mg Tablet, take 5 mg by mouth ., Disp: , Rfl:   .  pantoprazole (PROTONIX) 40 mg Tablet, Delayed Release (E.C.), take 1 tablet every day by mouth ., Disp: , Rfl:   .  LORazepam (ATIVAN) 0.5 mg Tablet, take 0.5 mg by mouth ., Disp: , Rfl:   .  phentermine (ADIPEX-P) 37.5 mg Tablet, 37.5 mg before breakfast ., Disp: , Rfl:   .  iron polysaccharides (NIFEREX) 150 mg iron Capsule, take 150 mg by mouth ., Disp: , Rfl:   .  TRI-SPRINTEC, 28, 0.18/0.215/0.25 mg-35 mcg (28) Tablet, , Disp: , Rfl:   Social History     Socioeconomic History   . Marital status: Married     Spouse name: Not on file   . Number of children: Not on file   . Years of education: Not on file   . Highest education level: Not on file    Occupational History   . Not on file   Tobacco Use   . Smoking status: Never   . Smokeless tobacco: Never   Substance and Sexual Activity   . Alcohol use: No   . Drug use: No   . Sexual activity: Not on file   Other Topics Concern   . Not on file   Social History Narrative   . Not on file     Social Determinants of Health     Financial Resource Strain: Not on file   Food Insecurity: Not on file   Transportation Needs: Not on file   Physical Activity: Not on file   Stress: Not on file   Social Connections: Not on file   Intimate Partner Violence: Not on file   Housing Stability: Not on file     No family history on file.    ROS: Complete 14point ROS was obtained with positives in HPI, otherwise negative.    There were no vitals taken for this visit.    Physical Exam:  My independent review of imaging studies demonstrates: CT 03/27/22 shows hypointensity in left temporal lobe, without compression, similar to arachnoid cyst     MRI from May 20, 2022 shows stable left-sided arachnoid cyst, no other acute pathology  The radiological images were reviewed with the patient    Assessment/Plan: No further surgical needs, defer to neurology      I have discussed the risks, benefits and alternatives according to my normal protocol, the patient verbalizes an understanding and wishes to proceed.    The identity of the patient, their current location (home) in the state of [place] was verified.  The patient was then informed about the process of using telemedicine for evaluation and treatment following the verbal consent prompts for telephonic. We discussed the ability to opt out of participating in the telemedicine encounter, ask questions, security issues and sharing information. The patient consented to proceed with the telemedicine encounter visit.     I spent 5 minutes on preparation for this visit on the date of service and 5 minutes directly with the patient.  I was located remotely at the following location  during this encounter: home

## 2022-06-17 ENCOUNTER — Ambulatory Visit (INDEPENDENT_AMBULATORY_CARE_PROVIDER_SITE_OTHER): Payer: Self-pay | Admitting: Physician Assistant

## 2022-06-25 ENCOUNTER — Ambulatory Visit (INDEPENDENT_AMBULATORY_CARE_PROVIDER_SITE_OTHER): Payer: Self-pay | Admitting: Physician Assistant

## 2022-07-01 ENCOUNTER — Ambulatory Visit (INDEPENDENT_AMBULATORY_CARE_PROVIDER_SITE_OTHER): Payer: Managed Care, Other (non HMO) | Admitting: Physician Assistant

## 2022-07-03 ENCOUNTER — Encounter (INDEPENDENT_AMBULATORY_CARE_PROVIDER_SITE_OTHER): Payer: Self-pay | Admitting: NEUROLOGY

## 2022-07-24 ENCOUNTER — Ambulatory Visit (INDEPENDENT_AMBULATORY_CARE_PROVIDER_SITE_OTHER): Payer: Managed Care, Other (non HMO) | Admitting: NEUROLOGY

## 2022-07-24 ENCOUNTER — Other Ambulatory Visit (INDEPENDENT_AMBULATORY_CARE_PROVIDER_SITE_OTHER): Payer: Self-pay | Admitting: Physician Assistant

## 2022-07-24 ENCOUNTER — Other Ambulatory Visit: Payer: Self-pay

## 2022-07-24 DIAGNOSIS — G909 Disorder of the autonomic nervous system, unspecified: Secondary | ICD-10-CM

## 2022-07-24 NOTE — Procedures (Signed)
NEUROLOGY, [facility]  [address] [place] [address]    Procedure Note    Name: [name] [name] MRN:  [id number]   Date: 07/24/2022 Age: 36 y.o.  DOB:   [birth year]       [id number] - NEEDLE ELECTROMYOGRAPHY; 2 EXTREMITIES W/ OR W/O RELATED PARASPINAL AREAS (AMB ONLY)    Performed by: Dyke Brackett, DO  Authorized by: Dyke Brackett, DO      A total of 8 nerves were studied by nerve conduction.  A total of 5 muscles (3 in the upper extremity and 2 in the lower extremity) were studied by electromyography.    ELECTROMYOGRAPHY REPORT    DATE OF SERVICE:  07/24/2022    PERFORMING PHYSICIAN:  Birdie Hopes, D.O.      REFERRING PHYSICIAN:  Birdie Hopes, D.O.    HISTORY:  She is a 36 year old woman who is referred for electrodiagnostic testing for recurrent syncopal events concerning for dysautonomia.  Please see initial visit note from May 31, 2022 for further details.    EXAMINATION:  Please see initial visit note from May 31, 2022 for detailed neurologic examination.    NERVE CONDUCTION STUDIES:  1.Normal right median-D2, ulnar-D5, radial-snuff box and sural sensory nerve action potentials (SNAPs).  2.Normal right median-APB, ulnar-ADM, fibular-EDB and tibial-AH compound muscle action potentials (CMAPs).    ELECTROMYOGRAPHY:  Normal studies for the right deltoid, triceps, 1st dorsal interosseous, medial gastrocnemius and tibialis anterior    IMPRESSION:  This is a normal study.  Specifically, there is no electrophysiologic evidence for large fiber polyneuropathy at this time.    This study is not designed to evaluate for small fiber sensory neuropathy.    CLINICAL NOTE:   The findings were discussed with the patient.  She continues to have syncopal events largely in the setting of changes in position.  She was trialed on Florinef by her cardiologist which did seem to help, however, her average blood pressures increased to systolics of the 170s and 180s so she was taken off.  She did go for  tilt-table testing recently though apparently it was completed inappropriately and so she is expecting a referral from her cardiologist to have this repeated likely in [place] at [facility].  We discussed that this EMG shows no evidence of a large fiber polyneuropathy, however, a small fiber neuropathy is still possible and is not infrequently associated with dysautonomia.  Her rheumatologic workup was unremarkable.  Today we discussed the utility of skin biopsy in the diagnosis of small fiber neuropathy.  I explained that a positive skin biopsy is unlikely to change our management.  She would like to proceed with biopsy as she wants to understand her condition better.  We could consider genetic testing for channelopathy given family history of cardiac arrhythmia if the biopsy is positive for SFN.    Patrick North, DO  Assistant Professor of Neurology  [facility]

## 2022-07-26 ENCOUNTER — Ambulatory Visit (INDEPENDENT_AMBULATORY_CARE_PROVIDER_SITE_OTHER): Payer: Self-pay | Admitting: NEUROLOGY

## 2022-08-06 ENCOUNTER — Ambulatory Visit (RURAL_HEALTH_CENTER): Payer: Self-pay | Admitting: Physician Assistant

## 2022-08-30 ENCOUNTER — Ambulatory Visit (INDEPENDENT_AMBULATORY_CARE_PROVIDER_SITE_OTHER): Payer: Self-pay | Admitting: NEUROLOGY

## 2022-09-02 ENCOUNTER — Other Ambulatory Visit (INDEPENDENT_AMBULATORY_CARE_PROVIDER_SITE_OTHER): Payer: Self-pay | Admitting: Physician Assistant

## 2022-09-05 ENCOUNTER — Ambulatory Visit: Payer: Managed Care, Other (non HMO) | Attending: Physician Assistant | Admitting: Physician Assistant

## 2022-09-05 ENCOUNTER — Encounter (INDEPENDENT_AMBULATORY_CARE_PROVIDER_SITE_OTHER): Payer: Self-pay | Admitting: Physician Assistant

## 2022-09-05 ENCOUNTER — Other Ambulatory Visit: Payer: Self-pay

## 2022-09-05 VITALS — BP 122/77 | HR 93 | Ht 63.0 in | Wt 219.8 lb

## 2022-09-05 DIAGNOSIS — I951 Orthostatic hypotension: Secondary | ICD-10-CM | POA: Insufficient documentation

## 2022-09-05 DIAGNOSIS — E782 Mixed hyperlipidemia: Secondary | ICD-10-CM

## 2022-09-05 DIAGNOSIS — R635 Abnormal weight gain: Secondary | ICD-10-CM | POA: Insufficient documentation

## 2022-09-05 DIAGNOSIS — R5382 Chronic fatigue, unspecified: Secondary | ICD-10-CM | POA: Insufficient documentation

## 2022-09-05 DIAGNOSIS — E559 Vitamin D deficiency, unspecified: Secondary | ICD-10-CM | POA: Insufficient documentation

## 2022-09-05 DIAGNOSIS — E669 Obesity, unspecified: Secondary | ICD-10-CM | POA: Insufficient documentation

## 2022-09-05 DIAGNOSIS — D509 Iron deficiency anemia, unspecified: Secondary | ICD-10-CM

## 2022-09-05 DIAGNOSIS — F32A Depression, unspecified: Secondary | ICD-10-CM | POA: Insufficient documentation

## 2022-09-05 DIAGNOSIS — F419 Anxiety disorder, unspecified: Secondary | ICD-10-CM | POA: Insufficient documentation

## 2022-09-05 MED ORDER — SOLIFENACIN 5 MG TABLET
5.0000 mg | ORAL_TABLET | Freq: Every day | ORAL | 5 refills | Status: DC
Start: 2022-09-05 — End: 2023-03-05

## 2022-09-05 MED ORDER — PANTOPRAZOLE 40 MG TABLET,DELAYED RELEASE
40.0000 mg | DELAYED_RELEASE_TABLET | Freq: Every day | ORAL | 5 refills | Status: DC
Start: 2022-09-05 — End: 2023-03-05

## 2022-09-05 MED ORDER — ZEPBOUND 2.5 MG/0.5 ML SUBCUTANEOUS PEN INJECTOR
2.5000 mg | PEN_INJECTOR | SUBCUTANEOUS | 2 refills | Status: DC
Start: 2022-09-05 — End: 2023-01-06

## 2022-09-05 MED ORDER — LORAZEPAM 0.5 MG TABLET
0.5000 mg | ORAL_TABLET | Freq: Two times a day (BID) | ORAL | 2 refills | Status: DC | PRN
Start: 2022-09-05 — End: 2022-12-10

## 2022-09-05 MED ORDER — CYANOCOBALAMIN (VIT B-12) 1,000 MCG/ML INJECTION SOLUTION
1000.0000 ug | Freq: Once | INTRAMUSCULAR | Status: AC
Start: 2022-09-05 — End: 2022-09-05
  Administered 2022-09-05: 1000 ug via INTRAMUSCULAR

## 2022-09-05 NOTE — Progress Notes (Signed)
INTERNAL MEDICINE, BUILDING A  [address]  [place] [address]  Operated by [facility]  History and Physical     Name: [name] [name] MRN:  [id number]   Date: 09/05/2022 Age: 37 y.o.               Follow Up        Reason for Visit: Follow Up (1 month f/u CDM ) fatigue weight gain med refill needs labs    History of Present Illness  [name] [name] is a 37 y.o. female who is being seen today in office for follow-up as well as complaints of increased fatigue and request for B12 injection.  She reports weight gain and request prescription for [facility] after discussing with her heart doctor need for weight loss.  Patient has been taking Adipex however due to her syncope in current cardiac workup cardiologist requested patient stopped medication and get on something such as Mounjaro.  States she was encouraged to discuss this with me today.  Her weight is up to 219 with a BMI 38.94.  Patient states she tries to watch what she eats but does not have a lot of time for exercise nor the energy to do so.    Once again patient has a history of syncope was referred to neurologist along with cardiologist with most recent visit by Dr. Overton Mam in [facility].  Patient was ordered a 30 day Holter monitor along with referral to [facility] specialist.  Patient states lately heart rate has been stable she has had no syncope and overall has been doing okay.    Last cardiology note as seen on January 4th:    [name] Bellow, DO - 08/29/2022 9:30 AM EST  Formatting of this note is different from the original.  Images from the original note were not included.    Phone: [phone]  Fax: [place] NOTE  08/29/2022  Patient Name: [name] [name]  DOB: 07-04-1986  PCP: Dr, No Pcp/Family  Referring Provider: Franchot Gallo    REASON FOR VISIT:    Chief Complaint  Patient presents with   Loss of Consciousness  Pt presents for consult for Syncope    HPI:  [name] [name] is a  37 y.o.female who presents today for evaluation of recurrent syncope. Patient states that for about a long as she can remember, at least from her preteen years, she has been experiencing frequent episodes of syncope. These episodes occur about once per week and almost uniformly occur when she is changing positions such as moving from lying to standing, or bending over and standing back up. She will have some warning in the form of dizziness and lightheadedness prior to this, and usually regains consciousness spontaneously in a couple of seconds. She had a particularly bad episode of this back in August, which was associated with some abdominal pain. She was seen by a cardiologist locally, who performed an echocardiogram which was largely unremarkable (I reviewed the report today in clinic). She also underwent a tilt table test, which did demonstrate some increase in her heart rate and drop in her blood pressure, though it does not appear it was diagnostic necessarily for POTS. She does not feel like the episodes of syncope have been occurring more frequently as of late, but she does feel like they last a bit longer than they have in the past. She was started on some Florinef by her previous cardiologist, but discontinued  this as it caused significantly elevated blood pressures. She is currently on phentermine for weight loss. She has no prior history of MI or stroke and she does not smoke. She has no chest pain, shortness of breath, PND, orthopnea.     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 Syncope and collapse R55 EKG (12-LEAD)   TELEMETRY (E.G.ACT) MONITOR HOOKUP AND CONTINUOUS FEED   AMB REFERRAL TO CARDIOLOGY       37 y.o. female who presents today for evaluation of recurrent syncope, which does seem to appear at an alarming rate,  usually at least once per week. Symptoms do sound most consistent with orthostatic hypotension or other form of similar dysautonomic syndrome. In light of how frequent these are occurring, it might be reasonable to get her into see cardiology at [facility] with their dysautonomia syndrome clinic. In the meantime however I will get a telemetry monitor to exclude an arrhythmogenic etiology. Her echocardiogram performed at an outside facility did not demonstrate any obvious structural abnormalities.    PLAN:   1. Will get a telemetry monitor  2. If this is unremarkable we will consider the addition of either midodrine or pyridostigmine. She did not tolerate Florinef due to hypertension  3. We did extensive discussion about lifestyle modification, which is one of the mainstays of treatment and the sorts of situations. I advised that she liberalize her salt and fluid intake and wear compression stockings. I also advised that she change positions slowly and wear compression stockings. I also advised that exercise can be helpful in these situations.  4. Referral generated to the dysautonomia clinic at [facility]    Disposition:   Return to clinic in: 2 months    Levonne Spiller, DO  [facility]       Follow-up iron deficiency with patient currently taking poly iron 150 mg b.i.d.Marland Kitchen  Last hemoglobin hematocrit were noted to be stable however follow-up labs are needed.  Once again fatigue issues noted.  Denies abdominal pain blood in the stool bowel changes.    Follow-up anxiety/depression for which patient is currently taking Ativan 0.5 twice daily; currently at this time she is not on any medications for depression and thinks most of her issues are anxiety related; overall Ativan seems to help calm her and specifically in the evenings helps her rest; she denies any panic attacks new issues or concerns.  Requests medication refill.    Follow-up elevated cholesterol currently not on statin therapy.  Once again  weight loss is needed in patient states that she has tried to watch her diet and admits to very low activity.  Follow-up cholesterol levels needed today.    Follow-up low magnesium as well as vitamin-D deficiency.  Previous labs noted slightly low Mag level 1.8.  She has currently not on magnesium supplement or vitamin-D.  She denies any arthralgias or joint pains.    Patient follows with GYN who recently placed her on Vesicare 5 mg daily for urinary incontinence    COVID/flu vaccination refused    PHQ Questionnaire       Past Medical History:   Diagnosis Date    Anxiety     Depression     Esophageal reflux     Iron deficiency anemia, unspecified     Unspecified  urinary incontinence     Weight loss due to medication          Past Surgical History:   Procedure Laterality Date    HX APPENDECTOMY      HX CHOLECYSTECTOMY      HX SALPINGECTOMY        Family Medical History:       Problem Relation (Age of Onset)    Arthritis-osteo Father    Coronary Artery Disease Mother    Diabetes type II Maternal Grandmother    Hypothyroidism Sister            Social History     Tobacco Use    Smoking status: Never    Smokeless tobacco: Never   Vaping Use    Vaping Use: Never used   Substance Use Topics    Alcohol use: Never    Drug use: Never     Medication:  POLY-IRON 150 mg iron Oral Capsule, TAKE 1 CAPSULE TWICE DAILY  fluconazole (DIFLUCAN) 150 mg Oral Tablet, Take first dose now if sign of yeast. Second tab in 3 days if needed.  LORazepam (ATIVAN) 0.5 mg Oral Tablet, Take 1 Tablet (0.5 mg total) by mouth Twice per day as needed for Anxiety  pantoprazole (PROTONIX) 40 mg Oral Tablet, Delayed Release (E.C.), Take 1 Tablet (40 mg total) by mouth Once a day  phentermine (ADIPEX-P) 37.5 mg Oral Tablet, Take 1 Tablet (37.5 mg total) by mouth Every morning before breakfast  solifenacin (VESICARE) 5 mg Oral Tablet, TAKE 1 TABLET by mouth ONCE DAILY    No facility-administered medications prior to visit.    Allergies:  Allergies    Allergen Reactions    Cefuroxime     Cefuroxime Axetil      Vomiting? Unsure to just po med.    Wellbutrin [Bupropion] Itching       Physical Exam:  Vitals:    09/05/22 1511   BP: 122/77   Pulse: 93   SpO2: 98%   Weight: 99.7 kg (219 lb 12.8 oz)   Height: 1.6 m (5\' 3" )   BMI: 39.02      Physical Exam  Vitals and nursing note reviewed.   Constitutional:       Appearance: Normal appearance. She is obese.   HENT:      Right Ear: Tympanic membrane normal.      Left Ear: Tympanic membrane normal.      Mouth/Throat:      Mouth: Mucous membranes are moist.      Pharynx: Oropharynx is clear.   Eyes:      Pupils: Pupils are equal, round, and reactive to light.   Cardiovascular:      Rate and Rhythm: Normal rate and regular rhythm.      Pulses: Normal pulses.   Pulmonary:      Effort: Pulmonary effort is normal.      Breath sounds: Normal breath sounds.   Abdominal:      General: Bowel sounds are normal.      Palpations: Abdomen is soft.      Tenderness: There is no abdominal tenderness.   Musculoskeletal:         General: No swelling or tenderness. Normal range of motion.      Cervical back: Normal range of motion and neck supple.   Skin:     General: Skin is warm.      Findings: No lesion or rash.   Neurological:      General: No focal  deficit present.      Mental Status: She is alert and oriented to person, place, and time.      Sensory: No sensory deficit.      Motor: No weakness.      Gait: Gait normal.   Psychiatric:         Mood and Affect: Mood normal.         Behavior: Behavior normal.         Thought Content: Thought content normal.         Judgment: Judgment normal.         Assessment/Plan:  Problem List Items Addressed This Visit          Cardiovascular System    Elevated triglycerides with high cholesterol    Relevant Orders    COMPREHENSIVE METABOLIC PNL, FASTING    LIPID PANEL    Dysautonomia orthostatic hypotension syndrome       Endocrine    Vitamin D deficiency    Relevant Orders    VITAMIN D 25 TOTAL        Hematologic/Lymphatic    Iron deficiency anemia, unspecified    Relevant Orders    CBC/DIFF    IRON TRANSFERRIN AND TIBC       Psychiatric    Depression    Anxiety       Other    Chronic fatigue - Primary    Relevant Orders    CBC/DIFF    THYROID STIMULATING HORMONE WITH FREE T4 REFLEX    FOLATE    Obesity, unspecified    Relevant Orders    COMPREHENSIVE METABOLIC PNL, FASTING    Hypomagnesemia    Relevant Orders    MAGNESIUM     Other Visit Diagnoses       Weight gain                   Lab order given to obtained at lab [employer] 30 day voucher of Mounjaro 2.5mg  weekly given  Will then try to get Zepbound 2.5 mg weekly approved if med tolerated  Strict diet exercise wt loss encouraged  B12 injection given  Meds refilled as noted  Continue follow-up with cardiologist in [facility] as well as [facility]  F/u pending results otherwise as scheduled       Follow up:   Post-Discharge Follow Up Appointments       Tuesday Dec 10, 2022    Return Patient Visit with Cleophus Molt, PA-C at  8:00 AM      Internal Medicine, Building A  [facility]  [address]  [place] [address]  [phone]           Seek medical attention for new or worsening symptoms.  Patient has been seen in this clinic within the last 3 years.     Trinton Prewitt, PA-C          This note was partially created using MModal Fluency Direct system (voice recognition software) and is inherently subject to errors including those of syntax and "sound-alike" substitutions which may escape proofreading.  In such instances, original meaning may be extrapolated by contextual derivation.

## 2022-10-04 ENCOUNTER — Encounter (INDEPENDENT_AMBULATORY_CARE_PROVIDER_SITE_OTHER): Payer: Self-pay | Admitting: Physician Assistant

## 2022-10-04 ENCOUNTER — Ambulatory Visit: Payer: Managed Care, Other (non HMO) | Attending: Physician Assistant | Admitting: Physician Assistant

## 2022-10-04 ENCOUNTER — Other Ambulatory Visit: Payer: Self-pay

## 2022-10-04 VITALS — BP 110/70 | HR 92 | Temp 98.7°F | Ht 63.0 in

## 2022-10-04 DIAGNOSIS — R0981 Nasal congestion: Secondary | ICD-10-CM | POA: Insufficient documentation

## 2022-10-04 DIAGNOSIS — R590 Localized enlarged lymph nodes: Secondary | ICD-10-CM | POA: Insufficient documentation

## 2022-10-04 DIAGNOSIS — J029 Acute pharyngitis, unspecified: Secondary | ICD-10-CM | POA: Insufficient documentation

## 2022-10-04 DIAGNOSIS — R059 Cough, unspecified: Secondary | ICD-10-CM | POA: Insufficient documentation

## 2022-10-04 DIAGNOSIS — J020 Streptococcal pharyngitis: Secondary | ICD-10-CM | POA: Insufficient documentation

## 2022-10-04 LAB — POCT RAPID COVID (SOFIA) (AMB ONLY): COVID-19 AG: NEGATIVE

## 2022-10-04 MED ORDER — FLUCONAZOLE 150 MG TABLET
150.0000 mg | ORAL_TABLET | Freq: Once | ORAL | 0 refills | Status: AC
Start: 2022-10-04 — End: 2022-10-04

## 2022-10-04 MED ORDER — AMOXICILLIN 875 MG-POTASSIUM CLAVULANATE 125 MG TABLET
1.0000 | ORAL_TABLET | Freq: Two times a day (BID) | ORAL | 0 refills | Status: AC
Start: 2022-10-04 — End: 2022-10-14

## 2022-10-04 MED ORDER — ONDANSETRON 4 MG DISINTEGRATING TABLET
4.0000 mg | ORAL_TABLET | Freq: Three times a day (TID) | ORAL | 0 refills | Status: DC | PRN
Start: 2022-10-04 — End: 2022-12-10

## 2022-10-04 MED ORDER — PROMETHAZINE-DM 6.25 MG-15 MG/5 ML ORAL SYRUP
5.0000 mL | ORAL_SOLUTION | Freq: Four times a day (QID) | ORAL | 0 refills | Status: AC | PRN
Start: 2022-10-04 — End: 2022-10-11

## 2022-10-04 MED ORDER — TRIAMCINOLONE ACETONIDE 40 MG/ML SUSPENSION FOR INJECTION
40.0000 mg | INTRAMUSCULAR | Status: AC
Start: 2022-10-04 — End: 2022-10-04
  Administered 2022-10-04: 40 mg via INTRAMUSCULAR

## 2022-10-04 NOTE — Nursing Note (Signed)
10/04/22 0900   Rapid Flu   Time Performed [id number]   Rapid Flu A Result Negative   Rapid Flu B Result Negative   Initials KH   POCT Instrument Sofia   Rapid Strep   Time Performed 0917   Rapid Strep Positive   Nurse initials [facility]   Ordering Physician Amy Alvis

## 2022-10-04 NOTE — Progress Notes (Signed)
[place], BUILDING A  Operated by [facility]  Progress Note    Name: [name] [name] MRN:  [id number]   Date: 10/04/2022 Age: 37 y.o.          Chief Complaint: General Illness       HPI: [name] [name] is a 37 y.o. female who complains of  sore throat w dry cough headache since yest. 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  At this time no fever chills body aches noted.  She does have some fatigue.      Allergies:  Allergies   Allergen Reactions    Cefuroxime     Cefuroxime Axetil      Vomiting? Unsure to just po med.    Wellbutrin [Bupropion] Itching       Current Medications:  LORazepam (ATIVAN) 0.5 mg Oral Tablet, Take 1 Tablet (0.5 mg total) by mouth Twice per day as needed for Anxiety  pantoprazole (PROTONIX) 40 mg Oral Tablet, Delayed Release (E.C.), Take 1 Tablet (40 mg total) by mouth Once a day  POLY-IRON 150 mg iron Oral Capsule, TAKE 1 CAPSULE TWICE DAILY  solifenacin (VESICARE) 5 mg Oral Tablet, Take 1 Tablet (5 mg total) by mouth Once a day  tirzepatide, weight loss, (ZEPBOUND) 2.5 mg/0.5 mL Subcutaneous Pen Injector, Inject 0.5 mL (2.5 mg total) under the skin Every 7 days Indications: pt has BMI of 38.94 along with dysautonomia orthostati hypotension/ elevated chol    No facility-administered medications prior to visit.       Past Medical History:   Diagnosis Date    Anxiety     Depression     Esophageal reflux     Iron deficiency anemia, unspecified     Unspecified urinary incontinence     Weight loss due to medication            Social History     Tobacco Use    Smoking status: Never    Smokeless tobacco: Never   Vaping Use    Vaping Use: Never used   Substance Use Topics    Alcohol use: Never    Drug use: Never       OBJECTIVE:  Vitals:    10/04/22 0910    BP: 110/70   Pulse: 92   Temp: 37.1 C (98.7 F)   TempSrc: Temporal   SpO2: 99%   Height: 1.6 m (5' 3"$ )      Physical Exam  Vitals and nursing note reviewed.   Constitutional:       Appearance: Normal appearance.   HENT:      Right Ear: Tympanic membrane normal.      Left Ear: Tympanic membrane normal.      Mouth/Throat:      Mouth: Mucous membranes are moist.      Pharynx: Oropharynx is clear. Posterior oropharyngeal erythema present.   Eyes:      Pupils: Pupils are equal, round, and reactive to light.   Cardiovascular:      Rate and Rhythm: Normal rate and regular rhythm.      Pulses: Normal pulses.   Pulmonary:      Effort: Pulmonary effort is normal.      Breath sounds: Normal breath sounds.   Abdominal:  Palpations: Abdomen is soft.      Tenderness: There is no abdominal tenderness.   Musculoskeletal:      Cervical back: Normal range of motion and neck supple.   Lymphadenopathy:      Cervical: Cervical adenopathy (With tenderness bilateral submandibular right greater than left) present.   Skin:     General: Skin is warm.      Findings: No rash.   Neurological:      Mental Status: She is alert and oriented to person, place, and time.   Psychiatric:         Behavior: Behavior normal.          Rapid Flu Results:   Rapid Flu A Result: Negative  Rapid Flu B Result: Negative     Rapid Strep Results:   Positive     COVID Results   Lab Results   Component Value Date    COVID19PCR Negative 10/04/2022               ASSESSMENT:     ICD-10-CM    1. Strep pharyngitis  J02.0 POCT Rapid Flu     POCT Rapid Strep     POCT Rapid Covid Threasa Alpha)      2. Cervical lymphadenopathy  R59.0       3. Cough, unspecified type  R05.9 POCT Rapid Flu     POCT Rapid Strep     POCT Rapid Covid (Sofia)      4. Congestion of nasal sinus  R09.81 POCT Rapid Flu     POCT Rapid Strep     POCT Rapid Covid Threasa Alpha)          PLAN:  Kenalog shot given for cervical lymphadenopathy/tenderness  Augmentin 875 b.i.d. times 10 days  Prometh DM p.r.n.  cough  Diflucan and Zofran given if needed  Rest fluids Tylenol Motrin p.r.n.   Call or return to clinic prn if these symptoms worsen or fail to improve as anticipated.    Post-Discharge Follow Up Appointments       Tuesday Dec 10, 2022    Return Patient Visit with Cleophus Molt, PA-C at  8:00 AM      Internal Medicine, Building A  [facility]  [address]  [place] [address]  [phone]             This note was partially created using MModal Fluency Direct system (voice recognition software) and is inherently subject to errors including those of syntax and "sound-alike" substitutions which may escape proofreading.  In such instances, original meaning may be extrapolated by contextual derivation.    Jihaad Bruschi, PA-C

## 2022-10-04 NOTE — Nursing Note (Signed)
Pt here with complaints of sore throat, cough, congestion. Started yesterday.

## 2022-10-15 ENCOUNTER — Other Ambulatory Visit (INDEPENDENT_AMBULATORY_CARE_PROVIDER_SITE_OTHER): Payer: Self-pay | Admitting: Physician Assistant

## 2022-10-15 MED ORDER — AZITHROMYCIN 250 MG TABLET
ORAL_TABLET | ORAL | 0 refills | Status: DC
Start: 2022-10-15 — End: 2023-01-06

## 2022-10-22 ENCOUNTER — Encounter (INDEPENDENT_AMBULATORY_CARE_PROVIDER_SITE_OTHER): Payer: Self-pay | Admitting: Physician Assistant

## 2022-10-22 ENCOUNTER — Ambulatory Visit: Payer: Managed Care, Other (non HMO) | Attending: Physician Assistant | Admitting: Physician Assistant

## 2022-10-22 ENCOUNTER — Other Ambulatory Visit: Payer: Self-pay

## 2022-10-22 VITALS — BP 106/80 | HR 83 | Temp 97.8°F | Ht 63.0 in

## 2022-10-22 DIAGNOSIS — J020 Streptococcal pharyngitis: Secondary | ICD-10-CM | POA: Insufficient documentation

## 2022-10-22 DIAGNOSIS — J358 Other chronic diseases of tonsils and adenoids: Secondary | ICD-10-CM | POA: Insufficient documentation

## 2022-10-22 DIAGNOSIS — J029 Acute pharyngitis, unspecified: Secondary | ICD-10-CM | POA: Insufficient documentation

## 2022-10-22 DIAGNOSIS — R52 Pain, unspecified: Secondary | ICD-10-CM | POA: Insufficient documentation

## 2022-10-22 DIAGNOSIS — R059 Cough, unspecified: Secondary | ICD-10-CM | POA: Insufficient documentation

## 2022-10-22 DIAGNOSIS — R5383 Other fatigue: Secondary | ICD-10-CM | POA: Insufficient documentation

## 2022-10-22 LAB — POCT RAPID COVID (SOFIA) (AMB ONLY): COVID-19 AG: NEGATIVE

## 2022-10-22 MED ORDER — FLUCONAZOLE 150 MG TABLET
150.0000 mg | ORAL_TABLET | Freq: Once | ORAL | 0 refills | Status: AC
Start: 2022-10-22 — End: 2022-10-22

## 2022-10-22 MED ORDER — DOXYCYCLINE HYCLATE 100 MG TABLET
100.0000 mg | ORAL_TABLET | Freq: Two times a day (BID) | ORAL | 0 refills | Status: AC
Start: 2022-10-22 — End: 2022-11-01

## 2022-10-22 NOTE — Nursing Note (Signed)
10/22/22 1500   Rapid Flu   Time Performed 1551   Rapid Flu A Result Negative   Rapid Flu B Result Negative   Initials KD,CMA   Influenza Culture Sent No   POCT Instrument Sofia   Rapid Strep   Time Performed 1551   Rapid Strep Positive   Nurse initials KD,CMA   Ordering Physician Amy Alben Deeds

## 2022-10-22 NOTE — Nursing Note (Signed)
Patient states that she is still not feeling well. Patient has complaints of sore throat, cough, congestion, and body aches.

## 2022-10-22 NOTE — Progress Notes (Signed)
[place], BUILDING A  Operated by [facility]  Progress Note    Name: [name] [name] MRN:  [id number]   Date: 10/22/2022 Age: 37 y.o.          Chief Complaint: General Illness       HPI: [name] [name] is a 37 y.o. female who complains of  recurrent sore throat w cough body aches fatigue since Sunday.  Patient had strep several weeks ago treated with Augmentin 875 b.i.d. for 10 days.  Prior to Augmentin she would use Z-Pak without relief. States she was feeling better then symptoms returned.  At this time she has no abdominal pain nausea vomiting diarrhea.  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    Allergies:  Allergies   Allergen Reactions    Cefuroxime     Cefuroxime Axetil      Vomiting? Unsure to just po med.    Wellbutrin [Bupropion] Itching       Current Medications:  azithromycin (ZITHROMAX) 250 mg Oral Tablet, Take 500 mg (2 tab) on day 1; take 250 mg (1 tab) on days 2-5.  LORazepam (ATIVAN) 0.5 mg Oral Tablet, Take 1 Tablet (0.5 mg total) by mouth Twice per day as needed for Anxiety  ondansetron (ZOFRAN ODT) 4 mg Oral Tablet, Rapid Dissolve, Take 1 Tablet (4 mg total) by mouth Every 8 hours as needed for Nausea/Vomiting  pantoprazole (PROTONIX) 40 mg Oral Tablet, Delayed Release (E.C.), Take 1 Tablet (40 mg total) by mouth Once a day  POLY-IRON 150 mg iron Oral Capsule, TAKE 1 CAPSULE TWICE DAILY  solifenacin (VESICARE) 5 mg Oral Tablet, Take 1 Tablet (5 mg total) by mouth Once a day  tirzepatide, weight loss, (ZEPBOUND) 2.5 mg/0.5 mL Subcutaneous Pen Injector, Inject 0.5 mL (2.5 mg total) under the skin Every 7 days Indications: pt has BMI of 38.94 along with dysautonomia orthostati hypotension/ elevated chol    No facility-administered medications prior to visit.       Past Medical History:   Diagnosis Date    Anxiety     Depression     Esophageal reflux     Iron deficiency anemia, unspecified     Unspecified  urinary incontinence     Weight loss due to medication            Social History     Tobacco Use    Smoking status: Never    Smokeless tobacco: Never   Vaping Use    Vaping status: Never Used   Substance Use Topics    Alcohol use: Never    Drug use: Never       OBJECTIVE:  Vitals:    10/22/22 1536   BP: 106/80   Pulse: 83   Temp: 36.6 C (97.8 F)   TempSrc: Tympanic   SpO2: 98%   Height: 1.6 m ('5\' 3"'$ )      Physical Exam  Vitals and nursing note reviewed.   Constitutional:       Appearance: Normal appearance.   HENT:      Right Ear: Tympanic membrane normal.      Left Ear: Tympanic membrane normal.      Mouth/Throat:      Mouth: Mucous membranes are moist.      Pharynx: Oropharynx is clear. Posterior oropharyngeal erythema present.      Comments: Large tonsil stone noted right side removed today  Eyes:  Pupils: Pupils are equal, round, and reactive to light.   Cardiovascular:      Rate and Rhythm: Normal rate and regular rhythm.      Pulses: Normal pulses.   Pulmonary:      Effort: Pulmonary effort is normal.      Breath sounds: Normal breath sounds.   Abdominal:      Palpations: Abdomen is soft.      Tenderness: There is no abdominal tenderness.   Musculoskeletal:      Cervical back: Normal range of motion and neck supple.   Lymphadenopathy:      Cervical: No cervical adenopathy.   Skin:     General: Skin is warm.      Findings: No rash.   Neurological:      Mental Status: She is alert and oriented to person, place, and time.   Psychiatric:         Behavior: Behavior normal.          Rapid Flu Results:   Rapid Flu A Result: Negative  Rapid Flu B Result: Negative     Rapid Strep Results:   Positive     Rapid COVID results  Negative    ASSESSMENT:     ICD-10-CM    1. Recurrent streptococcal pharyngitis  J02.0 POCT Rapid Covid ([facility]) (AMB Only)     POCT RAPID STREP A     POCT Influenza A & B     THROAT CULTURE, BETA HEMOLYTIC STREPTOCOCCUS      2. Tonsil stone  J35.8       3. Cough, unspecified type  R05.9 POCT  Rapid Covid (Sofia) (AMB Only)     POCT RAPID STREP A     POCT Influenza A & B      4. Body aches  R52 POCT Rapid Covid (Sofia) (AMB Only)     POCT RAPID STREP A     POCT Influenza A & B      5. Fatigue, unspecified type  R53.83 POCT Rapid Covid ([facility]ofia) (AMB Only)     POCT RAPID STREP A     POCT Influenza A & B          PLAN:  Strep culture obtained  Doxycycline 100 mg b.i.d. times 10 days   Rest fluids Tylenol Motrin p.r.n.   Call or return to clinic prn if these symptoms worsen or fail to improve as anticipated.    Post-Discharge Follow Up Appointments       Tuesday Dec 10, 2022    Return Patient Visit with Cleophus Molt, PA-C at  8:00 AM      Internal Medicine, Building A  [facility]  [address]  [place] [address]  [phone]           This note was partially created using MModal Fluency Direct system (voice recognition software) and is inherently subject to errors including those of syntax and "sound-alike" substitutions which may escape proofreading.  In such instances, original meaning may be extrapolated by contextual derivation.    Gershom Brobeck, PA-C

## 2022-10-25 LAB — THROAT CULTURE, BETA HEMOLYTIC STREPTOCOCCUS: THROAT CULTURE: NORMAL

## 2022-12-04 ENCOUNTER — Other Ambulatory Visit (INDEPENDENT_AMBULATORY_CARE_PROVIDER_SITE_OTHER): Payer: Self-pay | Admitting: Physician Assistant

## 2022-12-10 ENCOUNTER — Ambulatory Visit: Payer: Managed Care, Other (non HMO) | Attending: Physician Assistant | Admitting: Physician Assistant

## 2022-12-10 ENCOUNTER — Other Ambulatory Visit: Payer: Self-pay

## 2022-12-10 ENCOUNTER — Encounter (INDEPENDENT_AMBULATORY_CARE_PROVIDER_SITE_OTHER): Payer: Self-pay | Admitting: Physician Assistant

## 2022-12-10 VITALS — BP 120/78 | HR 73 | Temp 97.8°F | Wt 222.0 lb

## 2022-12-10 DIAGNOSIS — M5431 Sciatica, right side: Secondary | ICD-10-CM | POA: Insufficient documentation

## 2022-12-10 DIAGNOSIS — J020 Streptococcal pharyngitis: Secondary | ICD-10-CM | POA: Insufficient documentation

## 2022-12-10 DIAGNOSIS — R490 Dysphonia: Secondary | ICD-10-CM

## 2022-12-10 DIAGNOSIS — F32A Depression, unspecified: Secondary | ICD-10-CM | POA: Insufficient documentation

## 2022-12-10 DIAGNOSIS — F419 Anxiety disorder, unspecified: Secondary | ICD-10-CM

## 2022-12-10 DIAGNOSIS — R059 Cough, unspecified: Secondary | ICD-10-CM | POA: Insufficient documentation

## 2022-12-10 MED ORDER — PREDNISONE 20 MG TABLET
20.0000 mg | ORAL_TABLET | Freq: Two times a day (BID) | ORAL | 0 refills | Status: AC
Start: 2022-12-10 — End: 2022-12-15

## 2022-12-10 MED ORDER — PENICILLIN V POTASSIUM 500 MG TABLET
500.0000 mg | ORAL_TABLET | Freq: Four times a day (QID) | ORAL | 0 refills | Status: AC
Start: 2022-12-10 — End: 2022-12-20

## 2022-12-10 MED ORDER — TRIAMCINOLONE ACETONIDE 40 MG/ML SUSPENSION FOR INJECTION
40.0000 mg | INTRAMUSCULAR | Status: AC
Start: 2022-12-10 — End: 2022-12-10
  Administered 2022-12-10: 40 mg via INTRAMUSCULAR

## 2022-12-10 MED ORDER — PROMETHAZINE-DM 6.25 MG-15 MG/5 ML ORAL SYRUP
5.0000 mL | ORAL_SOLUTION | Freq: Four times a day (QID) | ORAL | 0 refills | Status: AC | PRN
Start: 2022-12-10 — End: 2022-12-17

## 2022-12-10 MED ORDER — LORAZEPAM 0.5 MG TABLET
0.5000 mg | ORAL_TABLET | Freq: Two times a day (BID) | ORAL | 2 refills | Status: DC | PRN
Start: 2022-12-10 — End: 2023-03-05

## 2022-12-10 MED ORDER — FLUCONAZOLE 150 MG TABLET
150.0000 mg | ORAL_TABLET | Freq: Once | ORAL | 1 refills | Status: AC
Start: 2022-12-10 — End: 2022-12-10

## 2022-12-10 NOTE — Nursing Note (Signed)
Patient is here for adult health maintenance. Pt stated she is pretty sure sciatic nerve is pinched is also stated her voice was completley gone yesterday and wants provider to check if she has laryngitis.

## 2022-12-10 NOTE — Progress Notes (Unsigned)
INTERNAL MEDICINE, BUILDING A  [address]  [place] [address]  Operated by [facility]  History and Physical     Name: [name] [name] MRN:  [id number]   Date: 12/10/2022 Age: 37 y.o.               Follow Up        Reason for Visit: Hoarseness, Cough (Sciatic pain), Follow Up 3 Months, and Medication Refill     History of Present Illness  [name] [name] is a 37 y.o. female who is being seen today in office for follow-up however has complaints of hoarseness w sore scratchy throat since Sunday. She also reports dry cough w/o wheezing sob. No fever chills body aches reported.    Complaints also of RT sciatic pain x 3 weeks. Hx of issues noted in the past and denies any fall or injury. No numbness tingling of leg noted and pain comes and goes.  Patient states she has used a Land in the past which has helped and will call for an appointment would like something at this time to help ease the pain if possible.    Follow-up anxiety/depression for which patient is currently taking Ativan 0.5 twice daily; currently at this time she is not on any medications for depression and thinks most of her issues are anxiety related; overall Ativan seems to help calm her and specifically in the evenings helps her rest; she denies any panic attacks new issues or concerns.  Requests medication refill.    Patient follows with GYN who recently placed her on Vesicare 5 mg daily for urinary incontinence    COVID/flu vaccination refused    PHQ Questionnaire       Past Medical History:   Diagnosis Date    Anxiety     Depression     Esophageal reflux     Iron deficiency anemia, unspecified     Unspecified urinary incontinence     Weight loss due to medication          Past Surgical History:   Procedure Laterality Date    HX APPENDECTOMY      HX CHOLECYSTECTOMY      HX SALPINGECTOMY        Family Medical History:       Problem Relation (Age of Onset)    Arthritis-osteo Father    Coronary Artery Disease  Mother    Diabetes type II Maternal Grandmother    Hypothyroidism Sister            Social History     Tobacco Use    Smoking status: Never    Smokeless tobacco: Never   Vaping Use    Vaping status: Never Used   Substance Use Topics    Alcohol use: Never    Drug use: Never     Medication:  azithromycin (ZITHROMAX) 250 mg Oral Tablet, Take 500 mg (2 tab) on day 1; take 250 mg (1 tab) on days 2-5.  pantoprazole (PROTONIX) 40 mg Oral Tablet, Delayed Release (E.C.), Take 1 Tablet (40 mg total) by mouth Once a day  POLY-IRON 150 mg iron Oral Capsule, TAKE 1 CAPSULE TWICE DAILY  solifenacin (VESICARE) 5 mg Oral Tablet, Take 1 Tablet (5 mg total) by mouth Once a day  tirzepatide, weight loss, (ZEPBOUND) 2.5 mg/0.5 mL Subcutaneous Pen Injector, Inject 0.5 mL (2.5 mg total) under the skin Every 7 days Indications: pt has BMI of 38.94 along with dysautonomia orthostati hypotension/ elevated chol  LORazepam (ATIVAN) 0.5 mg Oral Tablet, Take 1 Tablet (0.5 mg total) by mouth Twice per day as needed for Anxiety  ondansetron (ZOFRAN ODT) 4 mg Oral Tablet, Rapid Dissolve, Take 1 Tablet (4 mg total) by mouth Every 8 hours as needed for Nausea/Vomiting    No facility-administered medications prior to visit.    Allergies:  Allergies   Allergen Reactions    Cefuroxime     Cefuroxime Axetil      Vomiting? Unsure to just po med.    Wellbutrin [Bupropion] Itching       Physical Exam:  Vitals:    12/10/22 0813   BP: 120/78   Pulse: 73   Temp: 36.6 C (97.8 F)   SpO2: 99%   Weight: 101 kg (222 lb)      Physical Exam  Vitals and nursing note reviewed.   Constitutional:       Appearance: Normal appearance. She is obese.   HENT:      Right Ear: Tympanic membrane normal.      Left Ear: Tympanic membrane normal.      Mouth/Throat:      Mouth: Mucous membranes are moist.      Pharynx: Oropharynx is clear. Posterior oropharyngeal erythema present.      Comments: Tonsil Irritation right greater than left  Hoarseness noted  Eyes:      Pupils:  Pupils are equal, round, and reactive to light.   Cardiovascular:      Rate and Rhythm: Normal rate and regular rhythm.      Pulses: Normal pulses.   Pulmonary:      Effort: Pulmonary effort is normal.      Breath sounds: Normal breath sounds. No wheezing or rhonchi.   Abdominal:      Palpations: Abdomen is soft.      Tenderness: There is no abdominal tenderness.   Musculoskeletal:         General: No swelling or tenderness. Normal range of motion.      Cervical back: Normal range of motion and neck supple.      Comments: Tenderness with palpation right buttocks/sciatic area  Negative straight leg raise on right   Lymphadenopathy:      Cervical: No cervical adenopathy.   Skin:     General: Skin is warm.      Findings: No lesion or rash.   Neurological:      General: No focal deficit present.      Mental Status: She is alert and oriented to person, place, and time.      Sensory: No sensory deficit.      Motor: No weakness.      Gait: Gait normal.   Psychiatric:         Mood and Affect: Mood normal.         Behavior: Behavior normal.         Thought Content: Thought content normal.         Judgment: Judgment normal.            Rapid Strep Results:    Rapid Strep: Positive    Assessment/Plan:  Problem List Items Addressed This Visit       Depression    Anxiety     Other Visit Diagnoses       Strep pharyngitis    -  Primary    Relevant Orders    POCT RAPID STREP A    Hoarseness        Cough, unspecified type  Sciatic pain, right                 Kenalog injection given right buttocks for sciatic pain  Will start prednisone 20 b.i.d. x5 days tomorrow  Encouraged stretching as discussed and follow-up with chiropractor  Pen-VK 500 q.i.d. times 10 days  Diflucan 150 mg as directed p.r.n.  Prometh DM p.r.n. cough  Meds refilled as noted  Follow-up symptoms no better by Monday otherwise is scheduled    Post-Discharge Follow Up Appointments       Monday Mar 17, 2023    Return Patient Visit with Eyvonne Mechanic, PA-C at  8:00  AM      Internal Medicine, Building A  [facility]  [address]  [place] [address]  [phone]           Seek medical attention for new or worsening symptoms.  Patient has been seen in this clinic within the last 3 years.     Kizzy Olafson, PA-C          This note was partially created using MModal Fluency Direct system (voice recognition software) and is inherently subject to errors including those of syntax and "sound-alike" substitutions which may escape proofreading.  In such instances, original meaning may be extrapolated by contextual derivation.

## 2022-12-11 ENCOUNTER — Other Ambulatory Visit (INDEPENDENT_AMBULATORY_CARE_PROVIDER_SITE_OTHER): Payer: Self-pay

## 2022-12-11 DIAGNOSIS — E559 Vitamin D deficiency, unspecified: Secondary | ICD-10-CM

## 2022-12-11 DIAGNOSIS — D509 Iron deficiency anemia, unspecified: Secondary | ICD-10-CM

## 2022-12-11 DIAGNOSIS — E782 Mixed hyperlipidemia: Secondary | ICD-10-CM

## 2022-12-11 DIAGNOSIS — R5382 Chronic fatigue, unspecified: Secondary | ICD-10-CM

## 2022-12-11 DIAGNOSIS — E669 Obesity, unspecified: Secondary | ICD-10-CM

## 2022-12-11 LAB — COMPREHENSIVE METABOLIC PNL, FASTING: ESTIMATED GLOMERULAR FILTRATION RATE: 85

## 2022-12-18 ENCOUNTER — Ambulatory Visit: Payer: Managed Care, Other (non HMO) | Attending: Physician Assistant | Admitting: Physician Assistant

## 2022-12-18 ENCOUNTER — Encounter (INDEPENDENT_AMBULATORY_CARE_PROVIDER_SITE_OTHER): Payer: Self-pay | Admitting: Physician Assistant

## 2022-12-18 ENCOUNTER — Other Ambulatory Visit: Payer: Self-pay

## 2022-12-18 ENCOUNTER — Telehealth (INDEPENDENT_AMBULATORY_CARE_PROVIDER_SITE_OTHER): Payer: Self-pay | Admitting: Physician Assistant

## 2022-12-18 ENCOUNTER — Inpatient Hospital Stay (HOSPITAL_BASED_OUTPATIENT_CLINIC_OR_DEPARTMENT_OTHER)
Admission: RE | Admit: 2022-12-18 | Discharge: 2022-12-18 | Disposition: A | Discharge status: Home / Self Care | Payer: Managed Care, Other (non HMO) | Source: Ambulatory Visit | Attending: Physician Assistant | Admitting: Physician Assistant

## 2022-12-18 DIAGNOSIS — M5416 Radiculopathy, lumbar region: Secondary | ICD-10-CM | POA: Insufficient documentation

## 2022-12-18 DIAGNOSIS — M5441 Lumbago with sciatica, right side: Secondary | ICD-10-CM

## 2022-12-18 MED ORDER — PREDNISONE 10 MG TABLET
ORAL_TABLET | ORAL | 0 refills | Status: AC
Start: 2022-12-18 — End: 2022-12-29

## 2022-12-18 MED ORDER — METHOCARBAMOL 750 MG TABLET
750.0000 mg | ORAL_TABLET | Freq: Three times a day (TID) | ORAL | 0 refills | Status: AC | PRN
Start: 2022-12-18 — End: 2022-12-28

## 2022-12-18 MED ORDER — METHYLPREDNISOLONE SOD SUCC 125 MG SOLUTION FOR INJECTION WRAPPER
125.0000 mg | Freq: Once | INTRAVENOUS | Status: AC
Start: 2022-12-18 — End: 2022-12-18
  Administered 2022-12-18: 125 mg via INTRAMUSCULAR

## 2022-12-18 NOTE — Telephone Encounter (Signed)
-----   Message from Eyvonne Mechanic, [place] sent at 12/18/2022  3:54 PM EDT -----  Tell patient she has degenerative changes mostly L4-L5 with some disc narrowing so will get the MRI

## 2022-12-18 NOTE — Progress Notes (Signed)
INTERNAL MEDICINE, BUILDING A  [address]  [place] [address]  Operated by [facility]  Progress Note    Name: [name] [name] MRN:  [id number]   Date: 12/18/2022 DOB:  21-Jan-1986 (36 y.o.)              Chief Complaint: Back Pain (Sciatic pain)       HPI: [name] [name] is a 37 y.o. female who complains of  RT sided sciatic pain x since before easter. She has been seeing the chiropractor 3 times a week since 4/16 however no relief noted.  Patient on previous exam April 16th was given a Kenalog injection which she states may have helped a little bit but then the pain returned.  She now has increased right lower lumbar/posterior leg pain increase certain positions such as sitting to standing.  Patient states at times she feels like her right leg is even a little bit weak but denies numbness.  No known fall or injury reported she has an appointment again with the chiropractor tomorrow.    Allergies:  Allergies   Allergen Reactions    Cefuroxime     Cefuroxime Axetil      Vomiting? Unsure to just po med.    Wellbutrin [Bupropion] Itching       Current Medications:  azithromycin (ZITHROMAX) 250 mg Oral Tablet, Take 500 mg (2 tab) on day 1; take 250 mg (1 tab) on days 2-5.  LORazepam (ATIVAN) 0.5 mg Oral Tablet, Take 1 Tablet (0.5 mg total) by mouth Twice per day as needed for Anxiety  pantoprazole (PROTONIX) 40 mg Oral Tablet, Delayed Release (E.C.), Take 1 Tablet (40 mg total) by mouth Once a day  penicillin V potassium (VEETID) 500 mg Oral Tablet, Take 1 Tablet (500 mg total) by mouth Four times a day for 10 days  POLY-IRON 150 mg iron Oral Capsule, TAKE 1 CAPSULE TWICE DAILY  solifenacin (VESICARE) 5 mg Oral Tablet, Take 1 Tablet (5 mg total) by mouth Once a day  tirzepatide, weight loss, (ZEPBOUND) 2.5 mg/0.5 mL Subcutaneous Pen Injector, Inject 0.5 mL (2.5 mg total) under the skin Every 7 days Indications: pt has BMI of 38.94 along with dysautonomia orthostati hypotension/ elevated  chol    No facility-administered medications prior to visit.       Past Medical History:   Diagnosis Date    Anxiety     Depression     Esophageal reflux     Iron deficiency anemia, unspecified     Unspecified urinary incontinence     Weight loss due to medication            Social History     Tobacco Use    Smoking status: Never    Smokeless tobacco: Never   Vaping Use    Vaping status: Never Used   Substance Use Topics    Alcohol use: Never    Drug use: Never       OBJECTIVE:  There were no vitals filed for this visit.   Physical Exam  Constitutional:       Appearance: Normal appearance. She is obese.   Musculoskeletal:      Comments: Tenderness noted with palpation right buttocks/sciatic  Area positive straight leg raise right leg 75-80 degrees  Full range of motion right hip noted  Pedal pulses equal bilateral   Skin:     General: Skin is warm.   Neurological:      Mental Status: She is alert  and oriented to person, place, and time.   Psychiatric:         Mood and Affect: Mood normal.         Behavior: Behavior normal.          ASSESSMENT:     ICD-10-CM    1. Midline low back pain with right-sided sciatica, unspecified chronicity  M54.41 XR LUMBAR SPINE SERIES     MRI SPINE LUMBOSACRAL WO CONTRAST      2. Lumbar radiculopathy, right  M54.16           PLAN:  Solu-Medrol 100 mg given right buttocks for sciatic/radiculopathy pain  Will get x-ray of lumbar spine as well as MRI  Patient to start 60 mg prednisone daily x2 days and 50/40/30/20/10 mg daily x2 days each  Robaxin 750 mg t.i.d. p.r.n.  Continue stretching heat chiropractor  Will follow-up pending x-ray results   Call or return to clinic prn if these symptoms worsen or fail to improve as anticipated.    Post-Discharge Follow Up Appointments       Monday Mar 17, 2023    Return Patient Visit with Eyvonne Mechanic, PA-C at  8:00 AM      Internal Medicine, Building A  [facility]  [address]  [place] [address]  [phone]             This  note was partially created using MModal Fluency Direct system (voice recognition software) and is inherently subject to errors including those of syntax and "sound-alike" substitutions which may escape proofreading.  In such instances, original meaning may be extrapolated by contextual derivation.    Adael Culbreath, PA-C

## 2022-12-19 ENCOUNTER — Telehealth (INDEPENDENT_AMBULATORY_CARE_PROVIDER_SITE_OTHER): Payer: Self-pay | Admitting: Physician Assistant

## 2022-12-19 NOTE — Telephone Encounter (Signed)
Patient called requesting a referral to Dr. Alita Chyle in [place]. She stated that she knew you were ordering a MRI but she was just wondering if she could get the referral also.

## 2022-12-23 ENCOUNTER — Telehealth (INDEPENDENT_AMBULATORY_CARE_PROVIDER_SITE_OTHER): Payer: Self-pay | Admitting: Physician Assistant

## 2023-01-01 ENCOUNTER — Telehealth (INDEPENDENT_AMBULATORY_CARE_PROVIDER_SITE_OTHER): Payer: Self-pay | Admitting: Physician Assistant

## 2023-01-04 ENCOUNTER — Other Ambulatory Visit (HOSPITAL_COMMUNITY): Payer: Managed Care, Other (non HMO)

## 2023-01-06 ENCOUNTER — Other Ambulatory Visit: Payer: Self-pay

## 2023-01-06 ENCOUNTER — Encounter (INDEPENDENT_AMBULATORY_CARE_PROVIDER_SITE_OTHER): Payer: Self-pay | Admitting: Physician Assistant

## 2023-01-06 ENCOUNTER — Telehealth (INDEPENDENT_AMBULATORY_CARE_PROVIDER_SITE_OTHER): Payer: Self-pay | Admitting: Physician Assistant

## 2023-01-06 ENCOUNTER — Ambulatory Visit: Payer: Managed Care, Other (non HMO) | Attending: Physician Assistant | Admitting: Physician Assistant

## 2023-01-06 ENCOUNTER — Other Ambulatory Visit (INDEPENDENT_AMBULATORY_CARE_PROVIDER_SITE_OTHER): Payer: Self-pay

## 2023-01-06 VITALS — BP 124/80 | HR 84 | Temp 98.4°F | Wt 221.0 lb

## 2023-01-06 DIAGNOSIS — M5441 Lumbago with sciatica, right side: Secondary | ICD-10-CM

## 2023-01-06 DIAGNOSIS — M5136 Other intervertebral disc degeneration, lumbar region: Secondary | ICD-10-CM

## 2023-01-06 DIAGNOSIS — M5416 Radiculopathy, lumbar region: Secondary | ICD-10-CM | POA: Insufficient documentation

## 2023-01-06 IMAGING — MR MRI LUMBAR SPINE WITHOUT CONTRAST
4 of 6 series · 31 of 48 positions shown · IV contrast (gadolinium)
Comparison: None available.

﻿EXAM:  09594   MRI LUMBAR SPINE WITHOUT CONTRAST
INDICATION: Low back pain. Radicular symptoms to right hip and right leg.  No history of malignancy or back surgery.
TECHNIQUE: Multiplanar, multisequential MRI of the lumbosacral spine was performed without gadolinium contrast.

[Series 5: T2 · sagittal · 4.0mm · 0.94mm/px · 6 of 13 slices shown (1 of 3)]
[im 1/13]
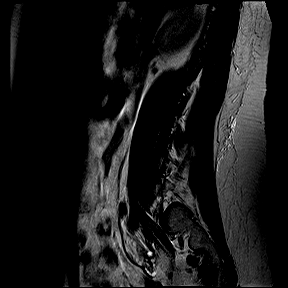
[im 3/13]
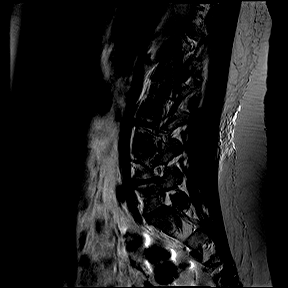
[im 5/13]
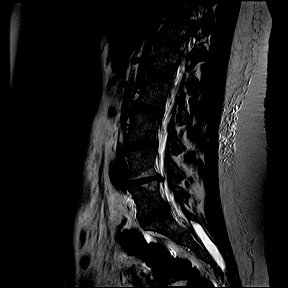
[im 8/13]
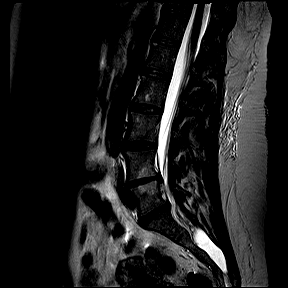
[im 10/13]
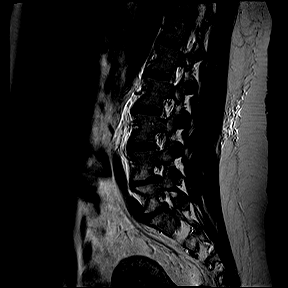
[im 13/13]
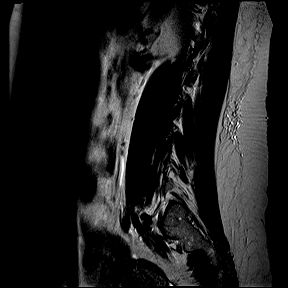

[Series 6: T1 · sagittal · 4.0mm · 0.94mm/px · 6 of 13 slices shown]
[im 1/13]
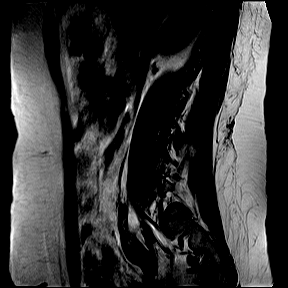
[im 3/13]
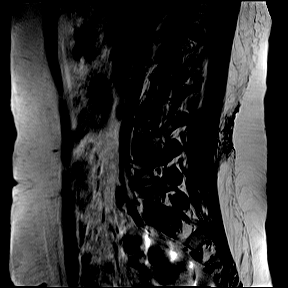
[im 5/13]
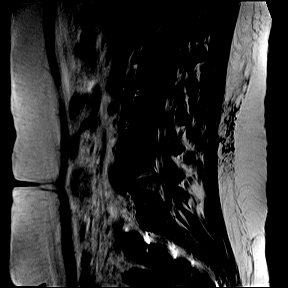
[im 8/13]
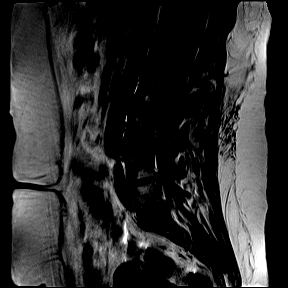
[im 10/13]
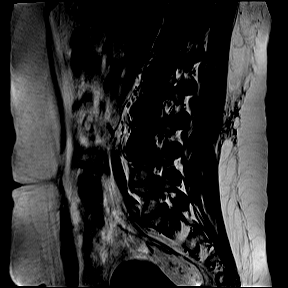
[im 13/13]
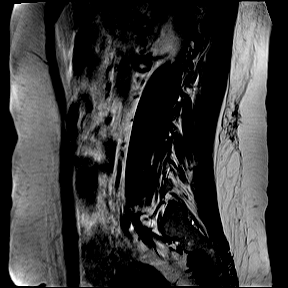

[Series 8: T2 · coronal · 5.0mm · 0.82mm/px · 8 of 18 slices shown (2 of 3)]
[im 1/18]
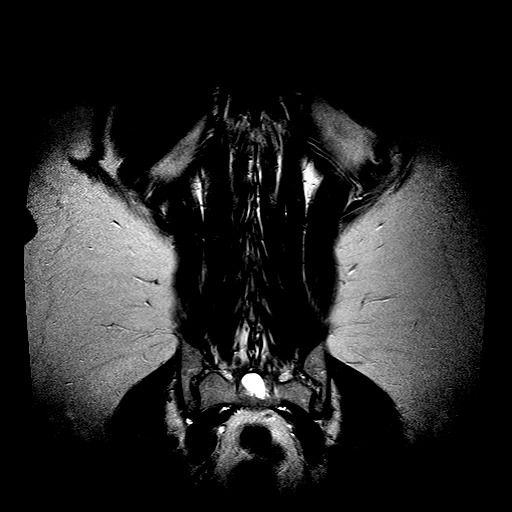
[im 3/18]
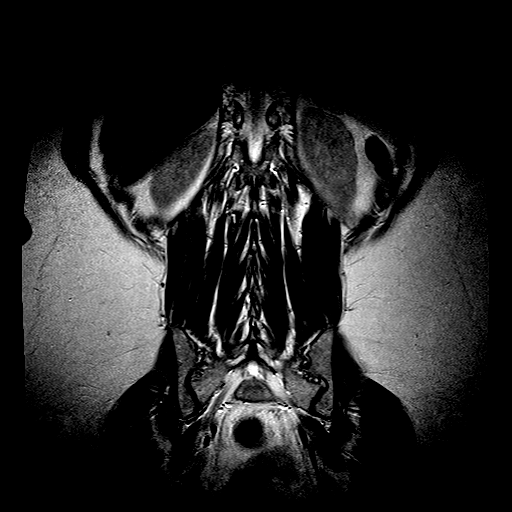
[im 5/18]
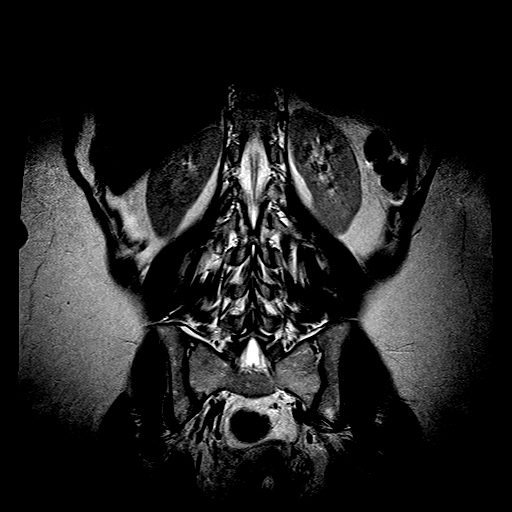
[im 8/18]
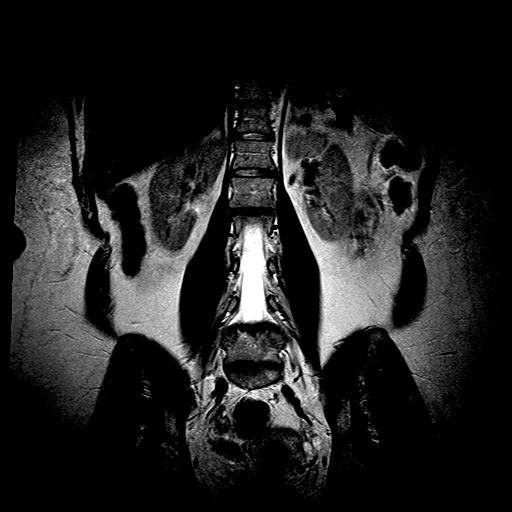
[im 10/18]
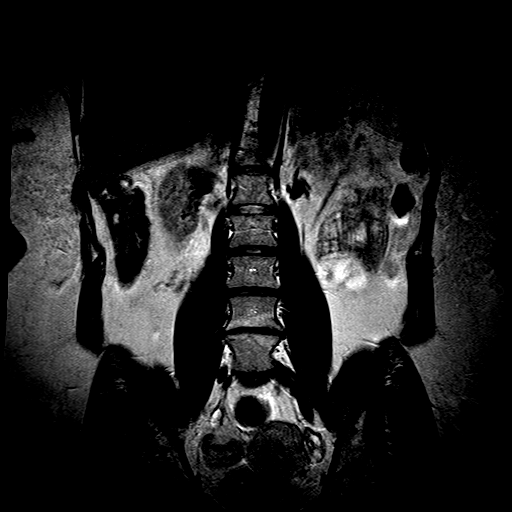
[im 13/18]
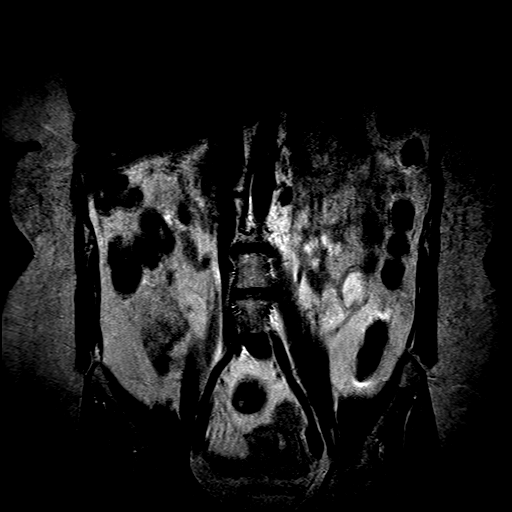
[im 15/18]
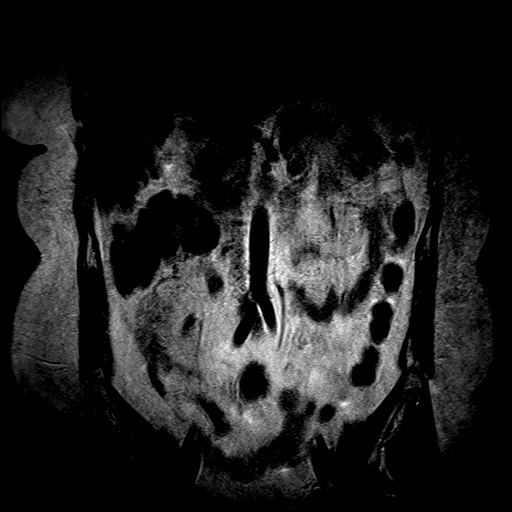
[im 18/18]
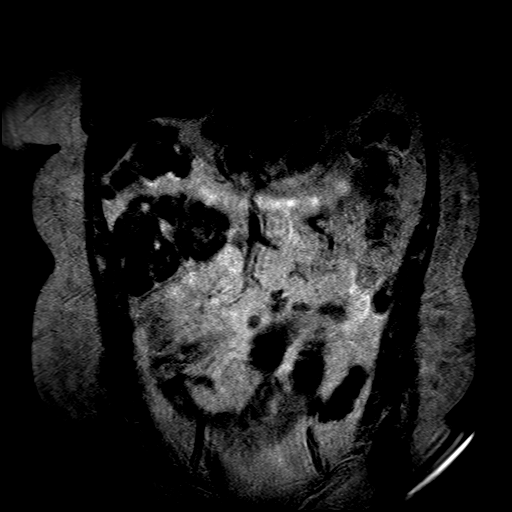

[Series 9: T2 · axial · 4.0mm · 0.52mm/px · z∈[-113,+99]mm · 11 of 23 slices shown (3 of 3)]
[im 1/23]
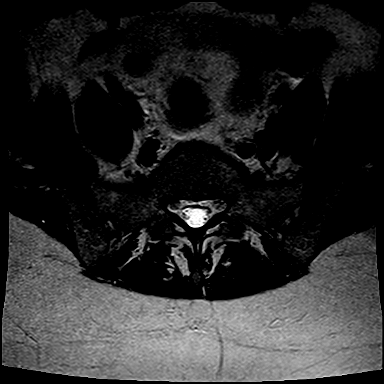
[im 3/23]
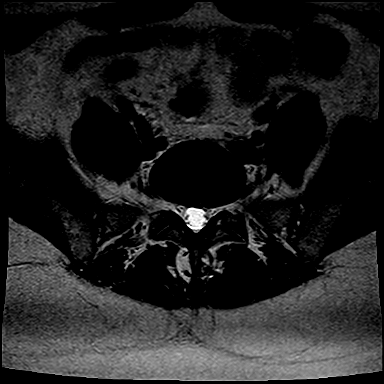
[im 5/23]
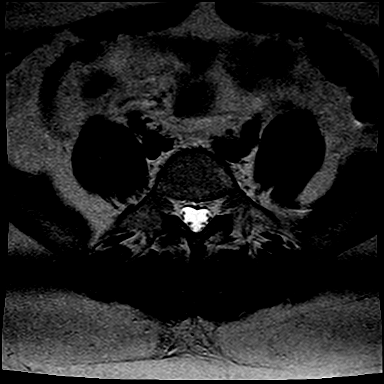
[im 7/23]
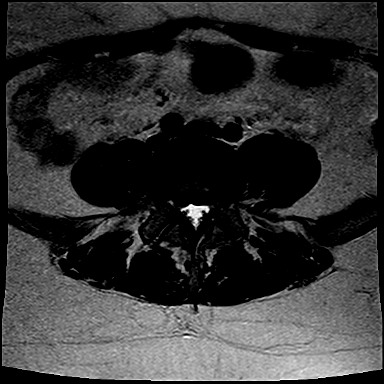
[im 9/23]
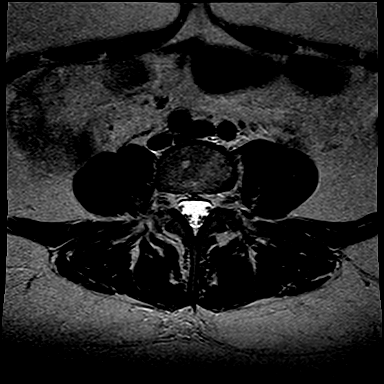
[im 12/23]
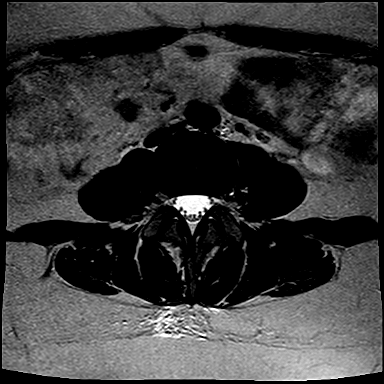
[im 14/23]
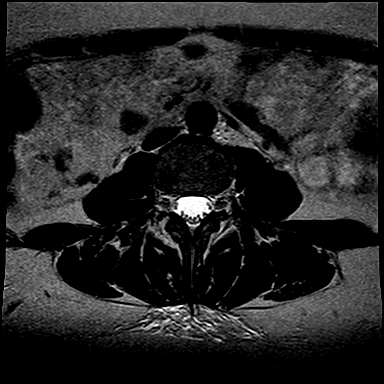
[im 16/23]
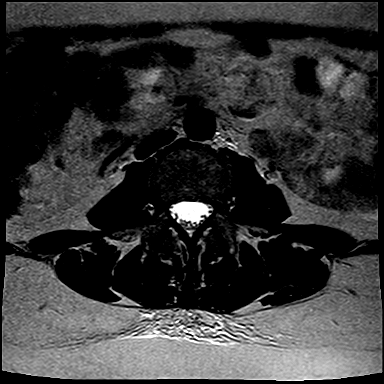
[im 18/23]
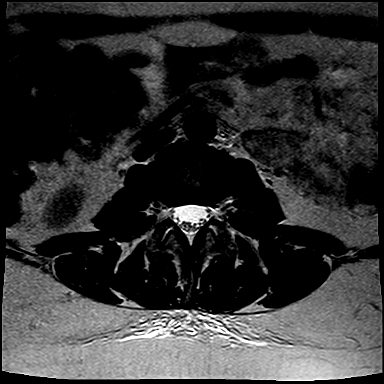
[im 20/23]
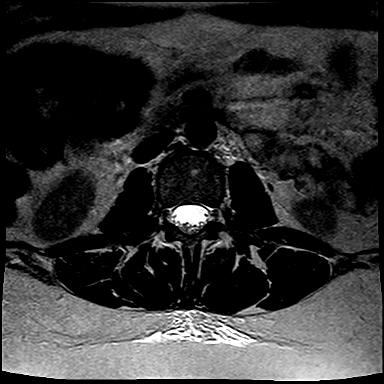
[im 23/23]
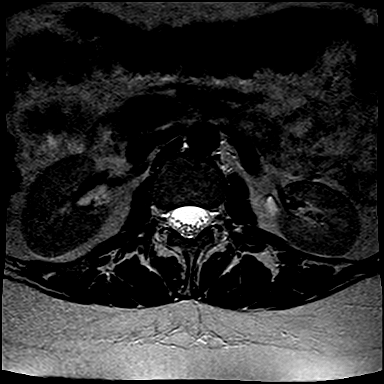

[31 of 48 positions shown; findings below may reference images not displayed]

FINDINGS: No acute or focal bone changes of lumbar vertebrae.  Conus medullaris and cauda equina are normal. 

At L1-2 level, no focal disc lesions are seen.

At L2-3 level, mild degenerative disc changes and facet arthropathy are noted without compromise of thecal sac or neural foramina. Similar finding is also noted at L3-4 level. 

At L4-5 and upper L5 level, good size bilobed disc protrusion is noted in the midline extending to both lateral recesses measuring transverse diameter of 20 mm causing significant compromise of both lateral recesses at L4-5, upper L5 level impinging on thecal sac and multiple nerve roots on both sides.  These changes are slightly more prominent on the right than the left.

At L5-S1 level, asymmetric bulging annulus to the left and degenerative disc changes with facet arthropathy are causing moderate left foraminal narrowing. 

Paravertebral soft tissues are unremarkable.
IMPRESSION: 1.  No acute bony lesions of lumbar vertebrae. 

2.  At L4-5 and upper L5 level, good size bilobed disc protrusion is noted in the midline extending to both lateral recesses measuring transverse diameter of 20 mm causing significant compromise of both lateral recesses at L4-5, upper L5 level impinging on thecal sac and multiple nerve roots on both sides.  These changes are slightly more prominent on the right than the left.

3. At L5-S1 level, asymmetric bulging annulus to the left and degenerative disc changes with facet arthropathy are causing moderate left foraminal narrowing. 

4. Findings at other disc levels are described above in detail.

## 2023-01-06 MED ORDER — CYCLOBENZAPRINE 10 MG TABLET
10.0000 mg | ORAL_TABLET | Freq: Three times a day (TID) | ORAL | 2 refills | Status: DC | PRN
Start: 2023-01-06 — End: 2023-03-05

## 2023-01-06 MED ORDER — ZEPBOUND 5 MG/0.5 ML SUBCUTANEOUS PEN INJECTOR
5.0000 mg | PEN_INJECTOR | SUBCUTANEOUS | 0 refills | Status: DC
Start: 2023-01-06 — End: 2023-02-25

## 2023-01-06 MED ORDER — IBUPROFEN 800 MG TABLET
800.0000 mg | ORAL_TABLET | Freq: Three times a day (TID) | ORAL | 1 refills | Status: DC | PRN
Start: 2023-01-06 — End: 2023-03-05

## 2023-01-06 NOTE — Telephone Encounter (Signed)
Patient called wanted to know if provider still wanted her to go to chiropractor.[name] [name]

## 2023-01-06 NOTE — Progress Notes (Signed)
INTERNAL MEDICINE, BUILDING A  [address]  [place] [address]  Operated by [facility]  Progress Note    Name: [name] [name] MRN:  [id number]   Date: 01/06/2023 DOB:  02/19/1986 (37 y.o.)              Chief Complaint: Follow Up (Paperwork regarding bulging disc)       HPI: [name] [name] is a 37 y.o. female who presents to office today to have paperwork completed for short-term disability C2 continued issues with her lower back.  Patient has had  complaints of  low back pain with RT sided radiculopathy since before easter. She has been seeing the chiropractor 3 times a week since 4/16 however no relief noted.  She states now the pain in her right leg seems to be more persistent and she finding it difficult to sit for more than 5-10 minutes or stand for more than 5-10 minutes of time.  We did order an MRI of her lower lumbar which she obtained on Friday however I do not have the results.  She has some pictures on her phone today which does appear as though she has some bulging diff states the chiropractor felt the same when he looked at him.  We are still awaiting the final results put in the radiologist however.  She comes today with paperwork for her job noting that she is unable at this time to do her daily job duties because of the pain in her back and leg.  She tells me that the chiropractor thought that she would need at least 5 weeks off .  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  She also needs a referral to neurosurgeon would like to see somebody in [place].  Patient was given Robaxin but states it really did not help her much would like to switch Flexeril possible.  She currently does not have anything for pain or inflammation.      Allergies:  Allergies   Allergen Reactions    Cefuroxime     Cefuroxime Axetil      Vomiting? Unsure to just po med.    Wellbutrin [Bupropion] Itching        Current Medications:  LORazepam (ATIVAN) 0.5 mg Oral Tablet, Take 1 Tablet (0.5 mg total) by mouth Twice per day as needed for Anxiety  pantoprazole (PROTONIX) 40 mg Oral Tablet, Delayed Release (E.C.), Take 1 Tablet (40 mg total) by mouth Once a day  POLY-IRON 150 mg iron Oral Capsule, TAKE 1 CAPSULE TWICE DAILY  solifenacin (VESICARE) 5 mg Oral Tablet, Take 1 Tablet (5 mg total) by mouth Once a day  azithromycin (ZITHROMAX) 250 mg Oral Tablet, Take 500 mg (2 tab) on day 1; take 250 mg (1 tab) on days 2-5.  tirzepatide, weight loss, (ZEPBOUND) 2.5 mg/0.5 mL Subcutaneous Pen Injector, Inject 0.5 mL (2.5 mg total) under the skin Every 7 days Indications: pt has BMI of 38.94 along with dysautonomia orthostati hypotension/ elevated chol    No facility-administered medications prior to visit.       Past Medical History:   Diagnosis Date    Anxiety     Depression     Esophageal reflux     Iron deficiency anemia, unspecified     Unspecified urinary incontinence     Weight loss due to medication  Social History     Tobacco Use    Smoking status: Never    Smokeless tobacco: Never   Vaping Use    Vaping status: Never Used   Substance Use Topics    Alcohol use: Never    Drug use: Never       OBJECTIVE:  Vitals:    01/06/23 1016   BP: 124/80   Pulse: 84   Temp: 36.9 C (98.4 F)   SpO2: 100%   Weight: 100 kg (221 lb)      Physical Exam  Constitutional:       Appearance: Normal appearance. She is obese.   Musculoskeletal:      Comments:  positive straight leg raise right leg 60-70 degrees  Decreased range of motion lower back due to pain  Full range of motion right hip noted  Pedal pulses equal bilateral  Gait stable   Skin:     General: Skin is warm.   Neurological:      Mental Status: She is alert and oriented to person, place, and time.   Psychiatric:         Mood and Affect: Mood normal.         Behavior: Behavior normal.          ASSESSMENT:     ICD-10-CM    1. Midline low back pain with right-sided  sciatica, unspecified chronicity  M54.41 Referral to External Provider (AMB)      2. Lumbar radiculopathy, right  M54.16 Referral to External Provider (AMB)      3. Bulging of lumbar intervertebral disc  M51.36 Referral to External Provider (AMB)          PLAN:    Awaiting final MRI report  Will refer to neurosurgeon in [facility]  DC Robaxin  Start Flexeril 10 mg t.i.d. p.r.n. along with Motrin 800 t.i.d. p.r.n. with food  Paperwork completed concerning short term disability                     Post-Discharge Follow Up Appointments       Monday Mar 17, 2023    Return Patient Visit with Eyvonne Mechanic, PA-C at  8:00 AM      Internal Medicine, Building A  Building [address], [place]  [address]  [place] [address]  [phone]             This note was partially created using MModal Fluency Direct system (voice recognition software) and is inherently subject to errors including those of syntax and "sound-alike" substitutions which may escape proofreading.  In such instances, original meaning may be extrapolated by contextual derivation.    Edder Bellanca, PA-C

## 2023-01-06 NOTE — Nursing Note (Signed)
Patient is here regarding paperwork for disability due to bulging discs in spine

## 2023-01-07 ENCOUNTER — Telehealth (INDEPENDENT_AMBULATORY_CARE_PROVIDER_SITE_OTHER): Payer: Self-pay | Admitting: Physician Assistant

## 2023-01-07 DIAGNOSIS — M5136 Other intervertebral disc degeneration, lumbar region: Secondary | ICD-10-CM

## 2023-01-07 DIAGNOSIS — M5441 Lumbago with sciatica, right side: Secondary | ICD-10-CM

## 2023-01-07 DIAGNOSIS — M5416 Radiculopathy, lumbar region: Secondary | ICD-10-CM

## 2023-01-07 NOTE — Telephone Encounter (Deleted)
-----   Message from Eyvonne Mechanic, [place] sent at 12/11/2022  8:31 AM EDT -----  Labs look good

## 2023-01-07 NOTE — Telephone Encounter (Signed)
-----   Message from Eyvonne Mechanic, [place] sent at 01/06/2023  4:38 PM EDT -----  Tell patient there was disc bulging as we discussed but years there is a protrusion with nerve impingement around L5  She was referred to neurosurgeon

## 2023-01-07 NOTE — Telephone Encounter (Signed)
-----   Message from Amy Alvis, PA-C sent at 01/06/2023  4:38 PM EDT -----  Tell patient there was disc bulging as we discussed but years there is a protrusion with nerve impingement around L5  She was referred to neurosurgeon

## 2023-01-11 MED ORDER — ONDANSETRON 4 MG DISINTEGRATING TABLET
ORAL_TABLET | ORAL | Status: AC
Start: 2023-01-11 — End: 2023-01-11
  Filled 2023-01-11: qty 1

## 2023-01-14 ENCOUNTER — Other Ambulatory Visit (HOSPITAL_COMMUNITY): Payer: Managed Care, Other (non HMO)

## 2023-02-24 NOTE — Progress Notes (Signed)
PROCEDURE     1. A right L5-S1 transforaminal epidural injection under fluoroscopic guidance.     2. Use of fluoroscopy was required to insure adequate delivery of medication into the epidural space and around the spinal nerve.     The patient was monitored with continuous pulse oximetry during the procedure.     DIAGNOSIS/INDICATIONS FOR THE PROCEDURE: Patient with LBP and radiculitis. Due to their pain they were set up for this injection. For specific information please refer to their history and physical and previous evaluation.  L5 radiculitis secondary to L4-L5 posterior disc herniation.    IMPRESSION/RESULTS: The patient tolerated the procedure well without any complications.     FOLLOW-UP APPOINTMENT: 2-3 weeks    INFORMED CONSENT: The patient understood the potential risks and benefits of the procedure which were explained to the patient prior to the procedure. The patient read and signed the consent stating complete understanding of this information, and wished to proceed with the procedure. Ample time was given for any questions to be answered prior to the procedure. The risks of the procedure were explained including, but not limited to the risk of bleeding and/or infection into the epidural space, disc or spine, nerve injury, nerve irritation, reaction to medications, etc. The patient denied any history of bleeding disorders or allergies to the medications being used or other medical contraindications to the procedure. No promises were given to any expected outcome.      PROCEDURE: A timeout was performed. The patient was sterilely prepped and draped with a triple scrub of betadine solution in the prone position. Careful aseptic technique was used throughout the procedure. The neuro-foramen was identified under fluoroscopic guidance using an oblique posterior-lateral approach. The skin and subcutaneous tissues were anesthetized with approximately 2-3 cc. of 1% Xylocaine. Then, a 25 gauge spinal needle  was inserted down into the foramen. A small amount of Isovue 200 mm (approximately 1-3 cc) was infiltrated under real time fluoroscopy, which eventually demonstrated satisfactory spread along the spinal nerve and tracking into the epidural space at that level. No arterial or venous flow was noted during the injection of the contrast prior to injecting any steroid or anesthetic and no aspirate was noted. Spot films were taken. Then, a solution containing 1.5 cc of dexamethasone (10mg /cc) mixed with 1.5 cc of 1% lidocaine was slowly infiltrated around the nerve and into the epidural space. Under real time fluoroscopy, there was good spread and eventual washout of contrast. The needle was then removed and a Band-Aid was applied if needed.  After the procedure patient suffered a vasovagal response while trying to stand.  Patient was laid down and given a cold towel.  Vital signs were taken after the procedure and were stable.  Patient was feeling back to baseline after resting for 20 minutes.     DISCHARGE SUMMARY: The patient was monitored for at least 10 minutes where they remained stable without any evidence of complications. The patient was discharged with discharge instructions in stable condition. If the patient has any problems, they were instructed to contact us.    Isaac Bliss, MD

## 2023-02-25 ENCOUNTER — Other Ambulatory Visit (INDEPENDENT_AMBULATORY_CARE_PROVIDER_SITE_OTHER): Payer: Self-pay | Admitting: Physician Assistant

## 2023-03-05 ENCOUNTER — Other Ambulatory Visit: Payer: Self-pay

## 2023-03-05 ENCOUNTER — Encounter (INDEPENDENT_AMBULATORY_CARE_PROVIDER_SITE_OTHER): Payer: Self-pay | Admitting: Physician Assistant

## 2023-03-05 ENCOUNTER — Ambulatory Visit: Payer: Managed Care, Other (non HMO) | Attending: Physician Assistant | Admitting: Physician Assistant

## 2023-03-05 VITALS — BP 112/80 | HR 84 | Temp 98.6°F | Wt 215.0 lb

## 2023-03-05 DIAGNOSIS — Z0000 Encounter for general adult medical examination without abnormal findings: Secondary | ICD-10-CM | POA: Insufficient documentation

## 2023-03-05 MED ORDER — ZEPBOUND 7.5 MG/0.5 ML SUBCUTANEOUS PEN INJECTOR
7.5000 mg | PEN_INJECTOR | SUBCUTANEOUS | 3 refills | Status: DC
Start: 2023-03-05 — End: 2023-06-05

## 2023-03-05 MED ORDER — SOLIFENACIN 5 MG TABLET
5.0000 mg | ORAL_TABLET | Freq: Every day | ORAL | 5 refills | Status: DC
Start: 2023-03-05 — End: 2023-06-05

## 2023-03-05 MED ORDER — PANTOPRAZOLE 40 MG TABLET,DELAYED RELEASE
40.0000 mg | DELAYED_RELEASE_TABLET | Freq: Every day | ORAL | 5 refills | Status: DC
Start: 2023-03-05 — End: 2023-06-05

## 2023-03-05 MED ORDER — POLYSACCHARIDE IRON COMPLEX 150 MG IRON CAPSULE
150.0000 mg | ORAL_CAPSULE | Freq: Two times a day (BID) | ORAL | 3 refills | Status: DC
Start: 2023-03-05 — End: 2023-06-05

## 2023-03-05 MED ORDER — IBUPROFEN 800 MG TABLET
800.0000 mg | ORAL_TABLET | Freq: Three times a day (TID) | ORAL | 1 refills | Status: AC | PRN
Start: 2023-03-05 — End: 2023-04-04

## 2023-03-05 MED ORDER — CYCLOBENZAPRINE 10 MG TABLET
10.0000 mg | ORAL_TABLET | Freq: Three times a day (TID) | ORAL | 2 refills | Status: DC | PRN
Start: 2023-03-05 — End: 2023-06-05

## 2023-03-05 MED ORDER — LORAZEPAM 0.5 MG TABLET
0.5000 mg | ORAL_TABLET | Freq: Two times a day (BID) | ORAL | 2 refills | Status: DC | PRN
Start: 2023-03-05 — End: 2023-06-05

## 2023-03-05 NOTE — Nursing Note (Signed)
Patient is here for yearly, pap is not due until 2027.. pt wants to up zepbound dose.

## 2023-03-05 NOTE — Progress Notes (Signed)
INTERNAL MEDICINE, BUILDING A  [address]  [place] [address]  Operated by [facility]  Progress Note    Name: [name] [name] MRN:  [id number]   Date: 03/05/2023 DOB:  1985/11/29 (37 y.o.)             03/05/2023    [name]    Chief Complaint   Patient presents with    Annual Wellness Exam       HPI:   Patient states that she is overall feeling well overall and has a Fair energy level.  She reports a well  balanced diet and does have plenty of water intake each day. Estimated she sleeps approx 7 hrs per night and has no sleep issues. She is currently not physically active due to her back issues w recent epidural injection at [facility] 02/24/23 .   Pt is now in PT and on restrictions due to hx of disc herniation. She states pain is some better post injection and overall stable. She also continues to use zepbound for wt loss and would like to go up to 7.5mg  weekly since she feels like she is at a standstill on 5mg  weekly. Overall has lost  25-30 lbs with med. Lab order needed.    Last Menstrual Period:  3 weeks ago  Sees Dr Beverely Pace GYN and pap due next year- currently on 3 year cycle       No results found for this or any previous visit (from the past 47829 hour(s)).      Past Medical History  Current Outpatient Medications   Medication Sig    cyclobenzaprine (FLEXERIL) 10 mg Oral Tablet Take 1 Tablet (10 mg total) by mouth Three times a day as needed for Muscle spasms    Ibuprofen (MOTRIN) 800 mg Oral Tablet Take 1 Tablet (800 mg total) by mouth Three times a day as needed for Pain for up to 30 days    LORazepam (ATIVAN) 0.5 mg Oral Tablet Take 1 Tablet (0.5 mg total) by mouth Twice per day as needed for Anxiety    pantoprazole (PROTONIX) 40 mg Oral Tablet, Delayed Release (E.C.) Take 1 Tablet (40 mg total) by mouth Once a day    polysaccharide iron complex (POLY-IRON) 150 mg iron Oral Capsule Take 1 Capsule (150 mg total) by mouth Twice daily    solifenacin (VESICARE) 5 mg Oral Tablet Take 1  Tablet (5 mg total) by mouth Once a day    tirzepatide, weight loss, (ZEPBOUND) 7.5 mg/0.5 mL Subcutaneous Pen Injector Inject 0.5 mL (7.5 mg total) under the skin Every 7 days     Allergies   Allergen Reactions    Cefuroxime     Cefuroxime Axetil      Vomiting? Unsure to just po med.    Wellbutrin [Bupropion] Itching     Past Medical History:   Diagnosis Date    Anxiety     Depression     Esophageal reflux     Iron deficiency anemia, unspecified     Unspecified urinary incontinence     Weight loss due to medication          Past Surgical History:   Procedure Laterality Date    HX APPENDECTOMY      HX CHOLECYSTECTOMY      HX SALPINGECTOMY           OB History    No obstetric history on file.       Family Medical History:  Problem Relation (Age of Onset)    Arthritis-osteo Father    Coronary Artery Disease Mother    Diabetes type II Maternal Grandmother    Hypothyroidism Sister            Social History     Socioeconomic History    Marital status: Married   Tobacco Use    Smoking status: Never    Smokeless tobacco: Never   Vaping Use    Vaping status: Never Used   Substance and Sexual Activity    Alcohol use: Never    Drug use: Never     Social Determinants of Health     Transportation Needs: No Transportation Needs (02/03/2023)    Received from [employer]     In the past 12 months, has lack of reliable transportation kept you from medical appointments, meetings, work or from getting things needed for daily living? : No   Housing Stability: Low Risk  (02/03/2023)    Received from [facility]    Living Situation     What is your living situation today?: I have a steady place to live     Think about the place you live. Do you have problems with any of the following? Choose all that apply:: None/None on this list       ROS:  Pertinent positives included in the HPI.     BP 112/80 (Site: Left, Patient Position: Sitting)   Pulse 84   Temp 37 C (98.6 F)   Wt 97.5 kg (215 lb)   SpO2 99%   BMI  38.09 kg/m       Vitals stable  BMI 38.09  HEENT:  pupils equal and reactive to light, TM's clear, no nasal congestion, throat nonerythematous  Neck:  no lymphadenopathy. no thyromegaly. no carotid bruits.   Lungs: clear to auscultation bilaterally, normal effort  CV: regular rate and rhythm without murmur, pulses 2+ throughout  Abdomen: soft, non-tender, nondistended, normoactive bowel sounds present  Skin: pink, warm, no rashes present  Musculoskeletal:  no joint deformities, no peripheral edema present, back non-tender on palpation  Neuro:  cranial nerves grossly intact, normal sensation, normal muscle tone and strength  Psych:  alert and oriented x 3, normal mood and affect      Gearl was seen today for annual wellness exam.    Diagnoses and all orders for this visit:    Well woman exam (no gynecological exam)  -     CBC/DIFF; Future  -     COMPREHENSIVE METABOLIC PNL, FASTING; Future  -     LIPID PANEL; Future    Other orders  -     cyclobenzaprine (FLEXERIL) 10 mg Oral Tablet; Take 1 Tablet (10 mg total) by mouth Three times a day as needed for Muscle spasms  -     Ibuprofen (MOTRIN) 800 mg Oral Tablet; Take 1 Tablet (800 mg total) by mouth Three times a day as needed for Pain for up to 30 days  -     LORazepam (ATIVAN) 0.5 mg Oral Tablet; Take 1 Tablet (0.5 mg total) by mouth Twice per day as needed for Anxiety  -     pantoprazole (PROTONIX) 40 mg Oral Tablet, Delayed Release (E.C.); Take 1 Tablet (40 mg total) by mouth Once a day  -     polysaccharide iron complex (POLY-IRON) 150 mg iron Oral Capsule; Take 1 Capsule (150 mg total) by mouth Twice daily  -  solifenacin (VESICARE) 5 mg Oral Tablet; Take 1 Tablet (5 mg total) by mouth Once a day  -     tirzepatide, weight loss, (ZEPBOUND) 7.5 mg/0.5 mL Subcutaneous Pen Injector; Inject 0.5 mL (7.5 mg total) under the skin Every 7 days      Continue diet exercise wt loss attempt  Continue zepbound 7.5mg  weekly  Continue f/u w [facility] concerning back  issues  Continue f/u w GYN for pap/pelvic exams      Return in 1 year (on 03/04/2024). Well exam    Zakeya Junker, PA-C

## 2023-03-15 ENCOUNTER — Other Ambulatory Visit (INDEPENDENT_AMBULATORY_CARE_PROVIDER_SITE_OTHER): Payer: Self-pay | Admitting: Physician Assistant

## 2023-03-17 ENCOUNTER — Ambulatory Visit: Payer: Managed Care, Other (non HMO) | Admitting: Physician Assistant

## 2023-05-12 ENCOUNTER — Other Ambulatory Visit (INDEPENDENT_AMBULATORY_CARE_PROVIDER_SITE_OTHER): Payer: Self-pay | Admitting: Physician Assistant

## 2023-06-05 ENCOUNTER — Ambulatory Visit: Payer: BC Managed Care – PPO | Attending: Physician Assistant | Admitting: Physician Assistant

## 2023-06-05 ENCOUNTER — Other Ambulatory Visit: Payer: Self-pay

## 2023-06-05 ENCOUNTER — Encounter (INDEPENDENT_AMBULATORY_CARE_PROVIDER_SITE_OTHER): Payer: Self-pay | Admitting: Physician Assistant

## 2023-06-05 VITALS — BP 102/78 | HR 85 | Ht 63.0 in | Wt 198.0 lb

## 2023-06-05 DIAGNOSIS — F32A Depression, unspecified: Secondary | ICD-10-CM

## 2023-06-05 DIAGNOSIS — F419 Anxiety disorder, unspecified: Secondary | ICD-10-CM

## 2023-06-05 DIAGNOSIS — R5382 Chronic fatigue, unspecified: Secondary | ICD-10-CM

## 2023-06-05 DIAGNOSIS — D509 Iron deficiency anemia, unspecified: Secondary | ICD-10-CM | POA: Insufficient documentation

## 2023-06-05 MED ORDER — PANTOPRAZOLE 40 MG TABLET,DELAYED RELEASE
40.0000 mg | DELAYED_RELEASE_TABLET | Freq: Every day | ORAL | 5 refills | Status: DC
Start: 2023-06-05 — End: 2023-09-04

## 2023-06-05 MED ORDER — SOLIFENACIN 5 MG TABLET
5.0000 mg | ORAL_TABLET | Freq: Every day | ORAL | 5 refills | Status: DC
Start: 2023-06-05 — End: 2023-09-04

## 2023-06-05 MED ORDER — POLYSACCHARIDE IRON COMPLEX 150 MG IRON CAPSULE
150.0000 mg | ORAL_CAPSULE | Freq: Two times a day (BID) | ORAL | 3 refills | Status: DC
Start: 2023-06-05 — End: 2023-09-04

## 2023-06-05 MED ORDER — CYCLOBENZAPRINE 10 MG TABLET
10.0000 mg | ORAL_TABLET | Freq: Three times a day (TID) | ORAL | 2 refills | Status: DC | PRN
Start: 2023-06-05 — End: 2023-09-04

## 2023-06-05 MED ORDER — LORAZEPAM 0.5 MG TABLET
0.5000 mg | ORAL_TABLET | Freq: Two times a day (BID) | ORAL | 2 refills | Status: DC | PRN
Start: 2023-06-05 — End: 2023-09-04

## 2023-06-05 MED ORDER — ZEPBOUND 7.5 MG/0.5 ML SUBCUTANEOUS PEN INJECTOR
7.5000 mg | PEN_INJECTOR | SUBCUTANEOUS | 3 refills | Status: DC
Start: 2023-06-05 — End: 2023-07-10

## 2023-06-05 NOTE — Progress Notes (Signed)
INTERNAL MEDICINE, BUILDING A  [address]  [place] [address]  Operated by [facility]  History and Physical     Name: [name] [name] MRN:  [id number]   Date: 06/05/2023 Age: 37 y.o.               Follow Up        Reason for Visit: Follow Up 3 Months (Routine )     History of Present Illness  [name] [name] is a 37 y.o. female who is being seen today in office for follow-up concerning fe def w pt currently on poly iron 150mg  bid. Pt still has some fatigue off and on however no abd pain BIS bowel changes.     Follow-up gastroesophageal reflux disease currently stable on Protonix 40 mg daily.  Patient denies any abdominal pain nausea vomiting diarrhea.    Follow-up anxiety/depression for which patient is currently taking Ativan 0.5 twice daily.  Patient is doing well on Ativan which seems to help calm her and specifically in the evenings helps her rest.  she denies any panic attacks new issues or concerns.  Requests medication refill.    She also history of  chronic fatigue issues so will check thyroid levels/ labs  at [facility]    Patient follows with GYN who  placed her on Vesicare 5 mg daily for urinary incontinence  She also continues to follow with cardiologist due to her previous issues with syncope.  Noted that she has been on Z bound in efforts to lose weight as well as recommended by her cardiologist.    [facility]  OFFICE NOTE   08/29/2022       Encounter Diagnosis   ICD-10-CM   1. 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 Syncope and collapse R55 EKG (12-LEAD)   TELEMETRY (E.G.ACT) MONITOR HOOKUP AND CONTINUOUS FEED   AMB REFERRAL TO CARDIOLOGY       37 y.o. female who presents today for evaluation of recurrent syncope, which does seem to appear at an alarming rate, usually at least once per week. Symptoms do sound most consistent with orthostatic hypotension or other form of similar dysautonomic syndrome. In  light of how frequent these are occurring, it might be reasonable to get her into see cardiology at [facility] with their dysautonomia syndrome clinic. In the meantime however I will get a telemetry monitor to exclude an arrhythmogenic etiology. Her echocardiogram performed at an outside facility did not demonstrate any obvious structural abnormalities.    PLAN:   1. Will get a telemetry monitor  2. If this is unremarkable we will consider the addition of either midodrine or pyridostigmine. She did not tolerate Florinef due to hypertension  3. We did extensive discussion about lifestyle modification, which is one of the mainstays of treatment and the sorts of situations. I advised that she liberalize her salt and fluid intake and wear compression stockings. I also advised that she change positions slowly and wear compression stockings. I also advised that exercise can be helpful in these situations.  4. Referral generated to the dysautonomia clinic at [facility]    Disposition:   Return to clinic in: 2 months    Levonne Spiller, DO  [facility] Cardiology  Assistant Professor, [facility]     COVID/flu vaccination refused    PHQ Questionnaire       Past Medical History:   Diagnosis Date  Anxiety     Depression     Esophageal reflux     Iron deficiency anemia, unspecified     Unspecified urinary incontinence     Weight loss due to medication          Past Surgical History:   Procedure Laterality Date    HX APPENDECTOMY      HX CHOLECYSTECTOMY      HX SALPINGECTOMY        Family Medical History:       Problem Relation (Age of Onset)    Arthritis-osteo Father    Coronary Artery Disease Mother    Diabetes type II Maternal Grandmother    Hypothyroidism Sister            Social History     Tobacco Use    Smoking status: Never    Smokeless tobacco: Never   Vaping Use    Vaping status: Never Used   Substance Use Topics    Alcohol use: Never    Drug use: Never      Medication:  cyclobenzaprine (FLEXERIL) 10 mg Oral Tablet, Take 1 Tablet (10 mg total) by mouth Three times a day as needed for Muscle spasms  LORazepam (ATIVAN) 0.5 mg Oral Tablet, Take 1 Tablet (0.5 mg total) by mouth Twice per day as needed for Anxiety  pantoprazole (PROTONIX) 40 mg Oral Tablet, Delayed Release (E.C.), Take 1 Tablet (40 mg total) by mouth Once a day  polysaccharide iron complex (POLY-IRON) 150 mg iron Oral Capsule, Take 1 Capsule (150 mg total) by mouth Twice daily  solifenacin (VESICARE) 5 mg Oral Tablet, Take 1 Tablet (5 mg total) by mouth Once a day  tirzepatide, weight loss, (ZEPBOUND) 7.5 mg/0.5 mL Subcutaneous Pen Injector, Inject 0.5 mL (7.5 mg total) under the skin Every 7 days    No facility-administered medications prior to visit.    Allergies:  Allergies   Allergen Reactions    Cefuroxime     Cefuroxime Axetil      Vomiting? Unsure to just po med.    Wellbutrin [Bupropion] Itching       Physical Exam:  Vitals:    06/05/23 1522   BP: 102/78   Pulse: 85   SpO2: 100%   Weight: 89.8 kg (198 lb)   Height: 1.6 m (5\' 3" )   BMI: 35.15      HEENT:  pupils equal and reactive to light, TM's clear, no nasal congestion, throat nonerythematous  Neck:  no lymphadenopathy. no thyromegaly. no carotid bruits.   Lungs: clear to auscultation bilaterally, normal effort  CV: regular rate and rhythm without murmur, pulses 2+ throughout  Abdomen: soft, non-tender, nondistended, normoactive bowel sounds present  Skin: pink, warm, no rashes present  Musculoskeletal:  no joint deformities, no peripheral edema present, back non-tender on palpation  Neuro:  cranial nerves grossly intact, normal sensation, normal muscle tone and strength  Psych:  alert and oriented x 3, normal mood and affect    Assessment/Plan:  Problem List Items Addressed This Visit       Depression    Iron deficiency anemia, unspecified - Primary    Relevant Orders    THYROID STIMULATING HORMONE WITH FREE T4 REFLEX    IRON TRANSFERRIN AND  TIBC    Anxiety    Chronic fatigue    Relevant Orders    THYROID STIMULATING HORMONE WITH FREE T4 REFLEX    IRON TRANSFERRIN AND TIBC    Hypomagnesemia  Labs ordered at [facility]  Meds refilled as noted  Flu shot refused  Continue following with gyn/cardiologist as scheduled  Will follow-up pending lab results otherwise as noted    Post-Discharge Follow Up Appointments       Thursday Sep 04, 2023    Return Patient Visit with Eyvonne Mechanic, PA-C at  3:15 PM      Monday Mar 08, 2024    Return Patient Visit with Eyvonne Mechanic, PA-C at  3:00 PM      Internal Medicine, Building A  [facility]  [address]  [place] [address]  [phone]              Seek medical attention for new or worsening symptoms.  Patient has been seen in this clinic within the last 3 years.     Shakiera Edelson, PA-C          This note was partially created using MModal Fluency Direct system (voice recognition software) and is inherently subject to errors including those of syntax and "sound-alike" substitutions which may escape proofreading.  In such instances, original meaning may be extrapolated by contextual derivation.

## 2023-06-05 NOTE — Nursing Note (Signed)
Patient is here today for routine 3 month follow up for medication refills.

## 2023-06-10 ENCOUNTER — Other Ambulatory Visit (INDEPENDENT_AMBULATORY_CARE_PROVIDER_SITE_OTHER): Payer: Self-pay | Admitting: Physician Assistant

## 2023-06-14 ENCOUNTER — Encounter (HOSPITAL_BASED_OUTPATIENT_CLINIC_OR_DEPARTMENT_OTHER): Payer: Self-pay

## 2023-06-14 ENCOUNTER — Other Ambulatory Visit: Payer: Self-pay

## 2023-06-14 ENCOUNTER — Emergency Department (HOSPITAL_BASED_OUTPATIENT_CLINIC_OR_DEPARTMENT_OTHER): Payer: BC Managed Care – PPO

## 2023-06-14 ENCOUNTER — Emergency Department
Admission: EM | Admit: 2023-06-14 | Discharge: 2023-06-14 | Disposition: A | Discharge status: Home / Self Care | Payer: BC Managed Care – PPO | Attending: Physician Assistant | Admitting: Physician Assistant

## 2023-06-14 DIAGNOSIS — N201 Calculus of ureter: Secondary | ICD-10-CM | POA: Insufficient documentation

## 2023-06-14 DIAGNOSIS — N139 Obstructive and reflux uropathy, unspecified: Secondary | ICD-10-CM | POA: Insufficient documentation

## 2023-06-14 LAB — URINALYSIS, MACRO/MICRO
BILIRUBIN: NEGATIVE mg/dL
BLOOD: NEGATIVE mg/dL
GLUCOSE: NEGATIVE mg/dL
KETONES: 40 mg/dL — AB
LEUKOCYTES: NEGATIVE WBCs/uL
NITRITE: NEGATIVE
PH: 7 (ref 4.6–8.0)
SPECIFIC GRAVITY: 1.01 (ref 1.003–1.035)
UROBILINOGEN: 0.2 mg/dL (ref 0.2–1.0)

## 2023-06-14 LAB — COMPREHENSIVE METABOLIC PANEL, NON-FASTING
ALBUMIN/GLOBULIN RATIO: 1.1 (ref 0.8–1.4)
ALBUMIN: 3.6 g/dL (ref 3.4–5.0)
ALKALINE PHOSPHATASE: 92 U/L (ref 46–116)
ALT (SGPT): 22 U/L (ref <=78)
ANION GAP: 12 mmol/L (ref 4–13)
AST (SGOT): 23 U/L (ref 15–37)
BILIRUBIN TOTAL: 0.8 mg/dL (ref 0.2–1.0)
BUN/CREA RATIO: 12
BUN: 12 mg/dL (ref 7–18)
CALCIUM, CORRECTED: 9.4 mg/dL
CALCIUM: 9.1 mg/dL (ref 8.5–10.1)
CHLORIDE: 103 mmol/L (ref 98–107)
CO2 TOTAL: 25 mmol/L (ref 21–32)
CREATININE: 1.01 mg/dL (ref 0.55–1.02)
ESTIMATED GFR: 74 mL/min/{1.73_m2} (ref >59)
GLOBULIN: 3.2
GLUCOSE: 92 mg/dL (ref 74–106)
OSMOLALITY, CALCULATED: 279 mosm/kg (ref 270–290)
POTASSIUM: 3.8 mmol/L (ref 3.5–5.1)
PROTEIN TOTAL: 6.8 g/dL (ref 6.4–8.2)
SODIUM: 140 mmol/L (ref 136–145)

## 2023-06-14 LAB — CBC WITH DIFF
BASOPHIL #: 0.02 10*3/uL (ref 0.00–0.30)
BASOPHIL %: 0 % (ref 0–3)
EOSINOPHIL #: 0.02 10*3/uL (ref 0.00–0.80)
EOSINOPHIL %: 0 % — ABNORMAL LOW (ref 1–7)
HCT: 43.3 % (ref 37.0–47.0)
HGB: 14.7 g/dL (ref 12.5–16.0)
LYMPHOCYTE #: 0.96 10*3/uL — ABNORMAL LOW (ref 1.10–5.00)
LYMPHOCYTE %: 12 % — ABNORMAL LOW (ref 25–45)
MCH: 30.4 pg (ref 27.0–32.0)
MCHC: 33.9 g/dL (ref 32.0–36.0)
MCV: 89.6 fL (ref 78.0–99.0)
MONOCYTE #: 0.43 10*3/uL (ref 0.00–1.30)
MONOCYTE %: 5 % (ref 0–12)
MPV: 7.8 fL (ref 7.4–10.4)
NEUTROPHIL #: 6.63 10*3/uL (ref 1.80–8.40)
NEUTROPHIL %: 82 % — ABNORMAL HIGH (ref 40–76)
PLATELETS: 288 10*3/uL (ref 140–440)
RBC: 4.83 10*6/uL (ref 4.20–5.40)
RDW: 14.4 % (ref 11.6–14.8)
WBC: 8.1 10*3/uL (ref 4.0–10.5)

## 2023-06-14 LAB — URINALYSIS, MICROSCOPIC

## 2023-06-14 MED ORDER — KETOROLAC 30 MG/ML (1 ML) INJECTION SOLUTION
30.0000 mg | INTRAMUSCULAR | Status: AC
Start: 2023-06-14 — End: 2023-06-14
  Administered 2023-06-14: 30 mg via INTRAVENOUS

## 2023-06-14 MED ORDER — OXYCODONE-ACETAMINOPHEN 5 MG-325 MG TABLET
1.0000 | ORAL_TABLET | Freq: Four times a day (QID) | ORAL | 0 refills | Status: DC | PRN
Start: 2023-06-14 — End: 2023-06-29

## 2023-06-14 MED ORDER — TAMSULOSIN 0.4 MG CAPSULE
0.4000 mg | ORAL_CAPSULE | Freq: Every evening | ORAL | 0 refills | Status: DC
Start: 2023-06-14 — End: 2023-09-04

## 2023-06-14 MED ORDER — ONDANSETRON HCL (PF) 4 MG/2 ML INJECTION SOLUTION
INTRAMUSCULAR | Status: AC
Start: 2023-06-14 — End: 2023-06-14
  Filled 2023-06-14: qty 2

## 2023-06-14 MED ORDER — KETOROLAC 30 MG/ML (1 ML) INJECTION SOLUTION
INTRAMUSCULAR | Status: AC
Start: 2023-06-14 — End: 2023-06-14
  Filled 2023-06-14: qty 1

## 2023-06-14 MED ORDER — HYDROMORPHONE 2 MG/ML INJECTION WRAPPER
INJECTION | INTRAMUSCULAR | Status: AC
Start: 2023-06-14 — End: 2023-06-14
  Filled 2023-06-14: qty 1

## 2023-06-14 MED ORDER — PROCHLORPERAZINE MALEATE 5 MG TABLET
5.0000 mg | ORAL_TABLET | Freq: Four times a day (QID) | ORAL | 0 refills | Status: DC | PRN
Start: 2023-06-14 — End: 2023-09-04

## 2023-06-14 MED ORDER — HYDROMORPHONE 2 MG/ML INJECTION WRAPPER
0.5000 mg | INJECTION | INTRAMUSCULAR | Status: AC
Start: 2023-06-14 — End: 2023-06-14
  Administered 2023-06-14: 0.5 mg via INTRAVENOUS

## 2023-06-14 MED ORDER — PROCHLORPERAZINE EDISYLATE 10 MG/2 ML (5 MG/ML) INJECTION SOLUTION
INTRAMUSCULAR | Status: AC
Start: 2023-06-14 — End: 2023-06-14
  Filled 2023-06-14: qty 2

## 2023-06-14 MED ORDER — OXYCODONE-ACETAMINOPHEN 5 MG-325 MG TABLET
ORAL_TABLET | ORAL | Status: AC
Start: 2023-06-14 — End: 2023-06-14
  Filled 2023-06-14: qty 1

## 2023-06-14 MED ORDER — OXYCODONE-ACETAMINOPHEN 5 MG-325 MG TABLET
1.0000 | ORAL_TABLET | ORAL | Status: AC
Start: 2023-06-14 — End: 2023-06-14
  Administered 2023-06-14: 1 via ORAL

## 2023-06-14 MED ORDER — PROCHLORPERAZINE EDISYLATE 10 MG/2 ML (5 MG/ML) INJECTION SOLUTION
5.0000 mg | INTRAMUSCULAR | Status: AC
Start: 2023-06-14 — End: 2023-06-14
  Administered 2023-06-14: 5 mg via INTRAVENOUS

## 2023-06-14 MED ORDER — ONDANSETRON HCL (PF) 4 MG/2 ML INJECTION SOLUTION
4.0000 mg | INTRAMUSCULAR | Status: AC
Start: 2023-06-14 — End: 2023-06-14
  Administered 2023-06-14: 4 mg via INTRAVENOUS

## 2023-06-14 NOTE — ED Provider Notes (Signed)
Emergency Medicine      Name: [name] [name]  Age and Gender: 37 y.o. female  Date of Birth: [birth year]  MRN: [id number]  PCP: Eyvonne Mechanic, PA-C    CC:  Chief Complaint   Patient presents with    Abdominal Pain       HPI:  [name] [name] is a 37 y.o. White female who presents to the ER with right flank pain.  Patient's mother says she awakened this morning with severe right flank pain with nausea and vomiting.  She says the patient has only had tiny amounts of urinary output since the onset of her pain.    Below pertinent information reviewed with patient:  Past Medical History:   Diagnosis Date    Anxiety     Depression     Esophageal reflux     Iron deficiency anemia, unspecified     Unspecified urinary incontinence     Weight loss due to medication            Allergies   Allergen Reactions    Cefuroxime     Cefuroxime Axetil      Vomiting? Unsure to just po med.    Wellbutrin [Bupropion] Itching       Past Surgical History:   Procedure Laterality Date    HX APPENDECTOMY      HX CHOLECYSTECTOMY      HX SALPINGECTOMY          Social History     Socioeconomic History    Marital status: Married   Tobacco Use    Smoking status: Never    Smokeless tobacco: Never   Vaping Use    Vaping status: Never Used   Substance and Sexual Activity    Alcohol use: Never    Drug use: Never     Social Determinants of Health     Transportation Needs: No Transportation Needs (02/03/2023)    [facility]    Transportation     In the past 12 months, has lack of reliable transportation kept you from medical appointments, meetings, work or from getting things needed for daily living? : No   Housing Stability: Low Risk  (02/03/2023)    [facility]    Housing Stability Vital Sign     What is your living situation today?: I have a steady place to live     Think about the place you live. Do you have problems with any of the following? Choose all that apply:: None/None on this list        ROS:  No other overt positive review of systems are noted other than stated in the HPI.      Objective:    ED Triage Vitals [06/14/23 1121]   BP (Non-Invasive) 106/63   Heart Rate (!) 104   Respiratory Rate (!) 22   Temperature 36.9 C (98.4 F)   SpO2 100 %   Weight 86.2 kg (190 lb)   Height 1.6 m (5\' 3" )     Filed Vitals:    06/14/23 1121 06/14/23 1638   BP: 106/63 106/79   Pulse: (!) 104 74   Resp: (!) 22 18   Temp: 36.9 C (98.4 F)    SpO2: 100% 100%       Nursing notes and vital signs reviewed.    Constitutional - In acute distress, writhing in pain.  Alert and Active.  HEENT - Normocephalic. Conjunctiva clear.  Moist mucous  membranes.   Neck - Trachea midline. No stridor. No hoarseness.  Cardiac - Regular rate and rhythm. No murmurs, rubs, or gallops.   Respiratory/Chest - Normal respiratory effort. Clear to auscultation bilaterally. No rales, wheezes or rhonchi. No chest tenderness.  Abdomen - Normal bowel sounds. Non-tender, soft, non-distended. No rebound or guarding.  Right CVA tenderness.  Musculoskeletal - Good AROM. No clubbing, cyanosis or edema.  Skin - Warm and dry, without any rashes or other lesions.  Neuro - Alert and oriented x 3. Moving all extremities symmetrically.   Psych - Normal mood and affect. Behavior is normal          Any pertinent labs and imaging obtained during this encounter reviewed below in MDM.    MDM/ED Course:      Medical Decision Making  Patient presents to the ER with right flank pain.  Patient's mother says she awakened this morning with severe right flank pain with nausea and vomiting.  She says the patient has only had tiny amounts of urinary output since the onset of her pain.  Differential diagnoses include renal colic, pyelonephritis, ovarian torsion.  Patient was given Toradol and Zofran initially with no improvement of her symptoms.  She was then given Dilaudid and Compazine with resolution of her pain and nausea.  She was found to have a 3.9 millimeter  proximal right ureteral stone.  Other diagnostics as noted in ED course.  Patient felt stable for discharge and will be given prescriptions for Percocet, Compazine, and Flomax as well as a Urology referral.  Has been given return precautions.    Amount and/or Complexity of Data Reviewed  Labs: ordered. Decision-making details documented in ED Course.  Radiology: ordered. Decision-making details documented in ED Course.  ECG/medicine tests: independent interpretation performed.    Risk  Prescription drug management.  Parenteral controlled substances.              ED Course as of 06/14/23 1640   Sat Jun 14, 2023   1223 COMPREHENSIVE METABOLIC PANEL, NON-FASTING  normal   1346 CT ABDOMEN PELVIS WO IV CONTRAST  Obstructive uropathy on the right due to a 3.9 mm proximal ureteral stone.    1526 CBC/DIFF(!)  Normal WBC, H&H, and platelets   1527 URINALYSIS WITH REFLEX MICROSCOPIC AND CULTURE IF POSITIVE(!)  Trace protein, 40 ketones, 0-3 WBCs, few bacteria, no WBCs       Orders Placed This Encounter    CT ABDOMEN PELVIS WO IV CONTRAST    CBC/DIFF    COMPREHENSIVE METABOLIC PANEL, NON-FASTING    URINALYSIS WITH REFLEX MICROSCOPIC AND CULTURE IF POSITIVE    CANCELED: HCG, URINE QUALITATIVE, PREGNANCY    CBC WITH DIFF    URINALYSIS, MACRO/MICRO    CANCELED: HCG QUALITATIVE PREGNANCY, SERUM    URINALYSIS, MICROSCOPIC    ketorolac (TORADOL) 30 mg/mL injection    ondansetron (ZOFRAN) 2 mg/mL injection    HYDROmorphone (DILAUDID) 2 mg/mL injection    prochlorperazine (COMPAZINE) 5 mg/mL injection    oxyCODONE-acetaminophen (PERCOCET) 5-325mg  per tablet    oxyCODONE-acetaminophen (PERCOCET) 5-325 mg Oral Tablet    prochlorperazine (COMPAZINE) 5 mg Oral Tablet    tamsulosin (FLOMAX) 0.4 mg Oral Capsule         Impression:   Clinical Impression   Right ureteral calculus (Primary)       Disposition: Discharged    / Maryagnes Amos PA-C  06/14/2023, 11:51  [facility]  Department of Emergency Medicine  [facility]  Portions of this note may have been dictated using voice recognition software.     -----------------------  Results for orders placed or performed during the hospital encounter of 06/14/23 (from the past 12 hour(s))   COMPREHENSIVE METABOLIC PANEL, NON-FASTING   Result Value Ref Range    SODIUM 140 136 - 145 mmol/L    POTASSIUM 3.8 3.5 - 5.1 mmol/L    CHLORIDE 103 98 - 107 mmol/L    CO2 TOTAL 25 21 - 32 mmol/L    ANION GAP 12 4 - 13 mmol/L    BUN 12 7 - 18 mg/dL    CREATININE 1.61 0.96 - 1.02 mg/dL    BUN/CREA RATIO 12     ESTIMATED GFR 74 >59 mL/min/1.70m^2    ALBUMIN 3.6 3.4 - 5.0 g/dL    CALCIUM 9.1 8.5 - 04.5 mg/dL    GLUCOSE 92 74 - 409 mg/dL    ALKALINE PHOSPHATASE 92 46 - 116 U/L    ALT (SGPT) 22 <=78 U/L    AST (SGOT) 23 15 - 37 U/L    BILIRUBIN TOTAL 0.8 0.2 - 1.0 mg/dL    PROTEIN TOTAL 6.8 6.4 - 8.2 g/dL    ALBUMIN/GLOBULIN RATIO 1.1 0.8 - 1.4    OSMOLALITY, CALCULATED 279 270 - 290 mOsm/kg    CALCIUM, CORRECTED 9.4 mg/dL    GLOBULIN 3.2    CBC WITH DIFF   Result Value Ref Range    WBC 8.1 4.0 - 10.5 x10^3/uL    RBC 4.83 4.20 - 5.40 x10^6/uL    HGB 14.7 12.5 - 16.0 g/dL    HCT 81.1 91.4 - 78.2 %    MCV 89.6 78.0 - 99.0 fL    MCH 30.4 27.0 - 32.0 pg    MCHC 33.9 32.0 - 36.0 g/dL    RDW 95.6 21.3 - 08.6 %    PLATELETS 288 140 - 440 x10^3/uL    MPV 7.8 7.4 - 10.4 fL    NEUTROPHIL % 82 (H) 40 - 76 %    LYMPHOCYTE % 12 (L) 25 - 45 %    MONOCYTE % 5 0 - 12 %    EOSINOPHIL % 0 (L) 1 - 7 %    BASOPHIL % 0 0 - 3 %    NEUTROPHIL # 6.63 1.80 - 8.40 x10^3/uL    LYMPHOCYTE # 0.96 (L) 1.10 - 5.00 x10^3/uL    MONOCYTE # 0.43 0.00 - 1.30 x10^3/uL    EOSINOPHIL # 0.02 0.00 - 0.80 x10^3/uL    BASOPHIL # 0.02 0.00 - 0.30 x10^3/uL   URINALYSIS, MACRO/MICRO   Result Value Ref Range    COLOR Yellow Light Yellow, Yellow    APPEARANCE Clear Clear    SPECIFIC GRAVITY 1.010 1.003 - 1.035    PH 7.0 4.6 - 8.0    LEUKOCYTES Negative Negative WBCs/uL    NITRITE Negative Negative    PROTEIN Trace (A) Negative mg/dL     GLUCOSE Negative Negative mg/dL    KETONES 40 (A) Negative mg/dL    BILIRUBIN Negative Negative mg/dL    BLOOD Negative Negative mg/dL    UROBILINOGEN 0.2 0.2 - 1.0 mg/dL   URINALYSIS, MICROSCOPIC   Result Value Ref Range    RBCS 0-3 0-3, Not Present /hpf    BACTERIA Few (A) Negative /hpf    MUCOUS Few Rare, Occasional, Few /hpf    AMORPHOUS SEDIMENT Few (A) (none) /hpf    WBCS Not Present Not Present, Occasional, 0-5 /hpf    SQUAMOUS  EPITHELIAL Few Not Present, Few /hpf     CT ABDOMEN PELVIS WO IV CONTRAST   Final Result   Obstructive uropathy on the right due to a 3.9 mm proximal ureteral stone.          One or more dose reduction techniques were used (e.g., Automated exposure control, adjustment of the mA and/or kV according to patient size, use of iterative reconstruction technique).         Radiologist location ID: [id number]

## 2023-06-14 NOTE — Discharge Instructions (Addendum)
Take Percocet as needed for pain, Zofran as needed for nausea, and Flomax to help pass the stone.  Take small sips of fluids to maintain hydration.  Return to the ER for fever, intractable pain, intractable vomiting.  If symptoms persist, follow up with urology for further management.  Return to the ER for other emergencies or as needed.

## 2023-06-14 NOTE — ED Triage Notes (Signed)
Patient c/o abdominal pain, flank pain, vomiting, and lightheadedness since approx 0700 this morning.

## 2023-06-14 NOTE — ED Nurses Note (Signed)
Patient discharged home all instructions gone over with patient including medications with opportunity to ask questions. Patient encouraged to increase fluid intake. Patient verbalized understanding and ambulated off unit independently with mother.

## 2023-06-14 NOTE — ED Nurses Note (Signed)
Patient lying in bed resting. She states her nausea and pain are improved at this time. She was able to complete CT scan.

## 2023-06-16 ENCOUNTER — Telehealth (INDEPENDENT_AMBULATORY_CARE_PROVIDER_SITE_OTHER): Payer: Self-pay | Admitting: Physician Assistant

## 2023-06-17 ENCOUNTER — Other Ambulatory Visit (INDEPENDENT_AMBULATORY_CARE_PROVIDER_SITE_OTHER): Payer: Self-pay

## 2023-06-17 ENCOUNTER — Other Ambulatory Visit: Payer: Self-pay

## 2023-06-17 ENCOUNTER — Encounter (INDEPENDENT_AMBULATORY_CARE_PROVIDER_SITE_OTHER): Payer: Self-pay | Admitting: Physician Assistant

## 2023-06-17 ENCOUNTER — Ambulatory Visit (INDEPENDENT_AMBULATORY_CARE_PROVIDER_SITE_OTHER): Payer: Self-pay | Admitting: Physician Assistant

## 2023-06-17 VITALS — BP 115/69 | HR 81 | Ht 63.0 in | Wt 193.0 lb

## 2023-06-17 DIAGNOSIS — D509 Iron deficiency anemia, unspecified: Secondary | ICD-10-CM

## 2023-06-17 DIAGNOSIS — N1330 Unspecified hydronephrosis: Secondary | ICD-10-CM

## 2023-06-17 DIAGNOSIS — Z0000 Encounter for general adult medical examination without abnormal findings: Secondary | ICD-10-CM

## 2023-06-17 DIAGNOSIS — N132 Hydronephrosis with renal and ureteral calculous obstruction: Secondary | ICD-10-CM

## 2023-06-17 DIAGNOSIS — N201 Calculus of ureter: Secondary | ICD-10-CM

## 2023-06-17 DIAGNOSIS — R5382 Chronic fatigue, unspecified: Secondary | ICD-10-CM

## 2023-06-17 NOTE — Progress Notes (Unsigned)
UROLOGY, [facility]  [address] [place] [address]    Progress Note    Name: [name] [name] MRN:  [id number]   Date: 06/17/2023 DOB:  09/03/1985 (37 y.o.)             Chief Complaint: New Patient (CT ABD/Pelvis 06/14/23), Kidney Stones, and Hydronephrosis  Subjective   Subjective:   ALANIZ SKIDMORE is a pleasant 37 y.o. White female whom presents to the clinic for right hydronephrosis secondary to right 4-42mm ureterolithiasis noted on CT Abdomen/Pelvis w/o IV con 06/14/23.Patient with right flank pain that is much improved. Patient denies fevers, chills, nausea, vomiting, hematuria, dysuria, incontinence, dribbling, hesitancy, suprapubic pain, headaches, vision changes, shortness of breath, chest pain.Patient denies personal or family history of urinary malignancy. Patient denies occupational chemical exposure. Patient denies history of tobacco use.       Objective   Objective :  BP 115/69 (Site: Right Arm, Patient Position: Sitting, Cuff Size: Adult)   Pulse 81   Ht 1.6 m (5\' 3" )   Wt 87.5 kg (193 lb)   BMI 34.19 kg/m     Gen: NAD, alert  Pulm: unlabored at rest  CV: palpable pulses  Abd: soft, Nt/ND  GU: no suprapubic tenderness, no CVAT    Data reviewed:    Current Outpatient Medications   Medication Sig    cyclobenzaprine (FLEXERIL) 10 mg Oral Tablet Take 1 Tablet (10 mg total) by mouth Three times a day as needed for Muscle spasms    LORazepam (ATIVAN) 0.5 mg Oral Tablet Take 1 Tablet (0.5 mg total) by mouth Twice per day as needed for Anxiety    oxyCODONE-acetaminophen (PERCOCET) 5-325 mg Oral Tablet Take 1 Tablet by mouth Every 6 hours as needed for Pain    pantoprazole (PROTONIX) 40 mg Oral Tablet, Delayed Release (E.C.) Take 1 Tablet (40 mg total) by mouth Once a day    polysaccharide iron complex (POLY-IRON) 150 mg iron Oral Capsule Take 1 Capsule (150 mg total) by mouth Twice daily    prochlorperazine (COMPAZINE) 5 mg Oral Tablet Take 1 Tablet (5 mg total) by mouth Every 6  hours as needed for Nausea/Vomiting    solifenacin (VESICARE) 5 mg Oral Tablet Take 1 Tablet (5 mg total) by mouth Once a day    tamsulosin (FLOMAX) 0.4 mg Oral Capsule Take 1 Capsule (0.4 mg total) by mouth Every evening after dinner    tirzepatide, weight loss, (ZEPBOUND) 7.5 mg/0.5 mL Subcutaneous Pen Injector Inject 0.5 mL (7.5 mg total) under the skin Every 7 days Indications: cardiovascular/ vasovascular prevention as recommended by cardiologist        Assessment/Plan  Problem List Items Addressed This Visit    None    Right Hydronephrosis secondary to right 4-41mm ureterolithiasis  I discussed the differential diagnosis, pathophysiology and nature of urolithiasis and recurrent stone disease.  Patient was counseled on available treatment options in the management of their calculus.  Conservative, medical expulsive therapy consisting of alpha-blocker therapy, analgesia and directed fluids which is generally reserved for ureteral stones <5 mm without complicating factors (infection, intractable pain/nausea)  The patient was counseled regarding treatment options for urinary calculi including observation, extracorporeal shockwave lithotripsy (ESWL), ureteroscopic extraction/intracorporeal laser lithotripsy, percutaneous nephrolithotomy (PCNL), or open ureterolithotomy/nephrolithotomy. The risks, advantages, and disadvantages of each were discussed.   Patient understands that the risks include but are not limited to bleeding, infection, damage to adjacent tissues, renal fracture/contusion/hemorrhage, anesthesia risks, loss of the kidney, steinstrasse,  therapeutic failure, possible need for post-operative drains/stents, need for additional procedures.  After further discussion, patient has elected to undergo cystoscopy, ureteroscopy with laser lithotripsy, stone manipulation and ureteral stent insertion at the next available date;  patient understands that more emergent intervention will be indicated should they  develop fevers/urinary tract infection, intractable pain and/or nausea      Adhya Cocco, PA-C

## 2023-06-18 ENCOUNTER — Telehealth (INDEPENDENT_AMBULATORY_CARE_PROVIDER_SITE_OTHER): Payer: Self-pay | Admitting: Physician Assistant

## 2023-06-18 ENCOUNTER — Telehealth (INDEPENDENT_AMBULATORY_CARE_PROVIDER_SITE_OTHER): Payer: Self-pay | Admitting: Student in an Organized Health Care Education/Training Program

## 2023-06-18 NOTE — Nursing Note (Signed)
Pt called stating she was having difficulty urinating.  States she is getting the urge to urinate but very little urine is coming out.  I instructed her that the stone my have moved and she may need evaluated in the ER d/t decreased urine output to see if the stone has moved.  Pt verbalized understanding stating she will go to the ER.      Returned pt call at 5:00 PM and had to leave a VM for pt to call me back regarding a CT.  Per [place], the pt may be trying to pass the stone and would need to drink fluid and ensure she is taking the Flomax to help pass the stone.

## 2023-06-18 NOTE — Telephone Encounter (Signed)
-----   Message from Eyvonne Mechanic sent at 06/18/2023  1:31 PM EDT -----  Crestor 5mg  nightly

## 2023-06-18 NOTE — Telephone Encounter (Signed)
Patient stated she went to urology appt yesterday and she is not scheduled for surgery until the 30th.. pt also stated she was in a lot of pain and could not pee.. uro told her the only thing they could do for her was tell her to go to ER.. pt does not want to go to ER and wants to know if provider could order her a CT to see if kidney stone has moved.[name] [name]

## 2023-06-25 ENCOUNTER — Other Ambulatory Visit (HOSPITAL_COMMUNITY): Payer: BC Managed Care – PPO

## 2023-06-25 ENCOUNTER — Encounter (INDEPENDENT_AMBULATORY_CARE_PROVIDER_SITE_OTHER): Payer: Self-pay | Admitting: Physician Assistant

## 2023-06-25 ENCOUNTER — Ambulatory Visit (HOSPITAL_COMMUNITY): Payer: BC Managed Care – PPO | Admitting: Anesthesiology

## 2023-06-25 ENCOUNTER — Encounter (HOSPITAL_COMMUNITY): Payer: Self-pay | Admitting: Student in an Organized Health Care Education/Training Program

## 2023-06-25 ENCOUNTER — Other Ambulatory Visit: Payer: Self-pay

## 2023-06-25 ENCOUNTER — Inpatient Hospital Stay
Admission: RE | Admit: 2023-06-25 | Discharge: 2023-06-25 | Disposition: A | Discharge status: Home / Self Care | Payer: BC Managed Care – PPO | Source: Ambulatory Visit | Attending: Student in an Organized Health Care Education/Training Program | Admitting: Student in an Organized Health Care Education/Training Program

## 2023-06-25 ENCOUNTER — Encounter (HOSPITAL_COMMUNITY)
Admission: RE | Disposition: A | Discharge status: Home / Self Care | Payer: Self-pay | Source: Ambulatory Visit | Attending: Student in an Organized Health Care Education/Training Program

## 2023-06-25 DIAGNOSIS — K219 Gastro-esophageal reflux disease without esophagitis: Secondary | ICD-10-CM | POA: Insufficient documentation

## 2023-06-25 DIAGNOSIS — E669 Obesity, unspecified: Secondary | ICD-10-CM | POA: Insufficient documentation

## 2023-06-25 DIAGNOSIS — F419 Anxiety disorder, unspecified: Secondary | ICD-10-CM | POA: Insufficient documentation

## 2023-06-25 DIAGNOSIS — N201 Calculus of ureter: Secondary | ICD-10-CM | POA: Insufficient documentation

## 2023-06-25 DIAGNOSIS — Z6833 Body mass index (BMI) 33.0-33.9, adult: Secondary | ICD-10-CM | POA: Insufficient documentation

## 2023-06-25 DIAGNOSIS — F32A Depression, unspecified: Secondary | ICD-10-CM | POA: Insufficient documentation

## 2023-06-25 DIAGNOSIS — Z87442 Personal history of urinary calculi: Secondary | ICD-10-CM | POA: Insufficient documentation

## 2023-06-25 HISTORY — DX: Syncope and collapse: R55

## 2023-06-25 LAB — HCG, URINE QUALITATIVE, PREGNANCY: HCG URINE QUALITATIVE: NEGATIVE

## 2023-06-25 SURGERY — CYSTOSCOPY WITH LASER LITHOTRIPSY
Anesthesia: General | Site: Ureter | Laterality: Right | Wound class: Clean Contaminated Wounds-The respiratory, GI, Genital, or urinary

## 2023-06-25 MED ORDER — SODIUM CHLORIDE 0.9 % (FLUSH) INJECTION SYRINGE
3.0000 mL | INJECTION | Freq: Three times a day (TID) | INTRAMUSCULAR | Status: DC
Start: 2023-06-25 — End: 2023-06-25

## 2023-06-25 MED ORDER — ONDANSETRON HCL (PF) 4 MG/2 ML INJECTION SOLUTION
4.0000 mg | Freq: Once | INTRAMUSCULAR | Status: AC
Start: 2023-06-25 — End: 2023-06-25
  Administered 2023-06-25: 4 mg via INTRAVENOUS

## 2023-06-25 MED ORDER — LACTATED RINGERS INTRAVENOUS SOLUTION
INTRAVENOUS | Status: DC
Start: 2023-06-25 — End: 2023-06-25

## 2023-06-25 MED ORDER — PROPOFOL 10 MG/ML IV BOLUS
INJECTION | Freq: Once | INTRAVENOUS | Status: DC | PRN
Start: 2023-06-25 — End: 2023-06-25
  Administered 2023-06-25: 200 mg via INTRAVENOUS

## 2023-06-25 MED ORDER — LEVOFLOXACIN 500 MG/100 ML IN 5 % DEXTROSE INTRAVENOUS PIGGYBACK
INJECTION | INTRAVENOUS | Status: AC
Start: 2023-06-25 — End: 2023-06-25
  Filled 2023-06-25: qty 100

## 2023-06-25 MED ORDER — SCOPOLAMINE 1 MG OVER 3 DAYS TRANSDERMAL PATCH
1.0000 | MEDICATED_PATCH | Freq: Once | TRANSDERMAL | Status: DC
Start: 2023-06-25 — End: 2023-06-25
  Administered 2023-06-25: 1 via TRANSDERMAL

## 2023-06-25 MED ORDER — DEXAMETHASONE SODIUM PHOSPHATE 4 MG/ML INJECTION SOLUTION
INTRAMUSCULAR | Status: AC
Start: 2023-06-25 — End: 2023-06-25
  Filled 2023-06-25: qty 1

## 2023-06-25 MED ORDER — ONDANSETRON HCL (PF) 4 MG/2 ML INJECTION SOLUTION
4.0000 mg | Freq: Once | INTRAMUSCULAR | Status: DC | PRN
Start: 2023-06-25 — End: 2023-06-25

## 2023-06-25 MED ORDER — ACETAMINOPHEN 325 MG TABLET
650.0000 mg | ORAL_TABLET | Freq: Four times a day (QID) | ORAL | Status: DC | PRN
Start: 2023-06-25 — End: 2023-06-25

## 2023-06-25 MED ORDER — LIDOCAINE (PF) 100 MG/5 ML (2 %) INTRAVENOUS SYRINGE
INJECTION | Freq: Once | INTRAVENOUS | Status: DC | PRN
Start: 2023-06-25 — End: 2023-06-25
  Administered 2023-06-25: 60 mg via INTRAVENOUS

## 2023-06-25 MED ORDER — ALBUTEROL SULFATE 2.5 MG/3 ML (0.083 %) SOLUTION FOR NEBULIZATION
2.5000 mg | INHALATION_SOLUTION | Freq: Once | RESPIRATORY_TRACT | Status: DC | PRN
Start: 2023-06-25 — End: 2023-06-25

## 2023-06-25 MED ORDER — FAMOTIDINE (PF) 20 MG/2 ML INTRAVENOUS SOLUTION
20.0000 mg | Freq: Once | INTRAVENOUS | Status: AC
Start: 2023-06-25 — End: 2023-06-25
  Administered 2023-06-25: 20 mg via INTRAVENOUS

## 2023-06-25 MED ORDER — KETOROLAC 30 MG/ML (1 ML) INJECTION SOLUTION
Freq: Once | INTRAMUSCULAR | Status: DC | PRN
Start: 2023-06-25 — End: 2023-06-25
  Administered 2023-06-25: 15 mg via INTRAVENOUS

## 2023-06-25 MED ORDER — SODIUM CHLORIDE 0.9 % (FLUSH) INJECTION SYRINGE
3.0000 mL | INJECTION | INTRAMUSCULAR | Status: DC | PRN
Start: 2023-06-25 — End: 2023-06-25

## 2023-06-25 MED ORDER — FENTANYL (PF) 50 MCG/ML INJECTION SOLUTION
INTRAMUSCULAR | Status: AC
Start: 2023-06-25 — End: 2023-06-25
  Filled 2023-06-25: qty 2

## 2023-06-25 MED ORDER — SCOPOLAMINE 1 MG OVER 3 DAYS TRANSDERMAL PATCH
MEDICATED_PATCH | TRANSDERMAL | Status: AC
Start: 2023-06-25 — End: 2023-06-25
  Filled 2023-06-25: qty 1

## 2023-06-25 MED ORDER — ROCURONIUM 10 MG/ML INTRAVENOUS SOLUTION
Freq: Once | INTRAVENOUS | Status: DC | PRN
Start: 2023-06-25 — End: 2023-06-25
  Administered 2023-06-25: 30 mg via INTRAVENOUS

## 2023-06-25 MED ORDER — FENTANYL (PF) 50 MCG/ML INJECTION WRAPPER
50.0000 ug | INJECTION | INTRAMUSCULAR | Status: DC | PRN
Start: 2023-06-25 — End: 2023-06-25

## 2023-06-25 MED ORDER — FENTANYL (PF) 50 MCG/ML INJECTION WRAPPER
INJECTION | Freq: Once | INTRAMUSCULAR | Status: DC | PRN
Start: 2023-06-25 — End: 2023-06-25
  Administered 2023-06-25 (×2): 50 ug via INTRAVENOUS

## 2023-06-25 MED ORDER — MIDAZOLAM 5 MG/ML INJECTION WRAPPER
INTRAMUSCULAR | Status: AC
Start: 2023-06-25 — End: 2023-06-25
  Filled 2023-06-25: qty 1

## 2023-06-25 MED ORDER — SUGAMMADEX 100 MG/ML INTRAVENOUS SOLUTION
Freq: Once | INTRAVENOUS | Status: DC | PRN
Start: 2023-06-25 — End: 2023-06-25
  Administered 2023-06-25: 300 mg via INTRAVENOUS

## 2023-06-25 MED ORDER — DEXAMETHASONE SODIUM PHOSPHATE 4 MG/ML INJECTION SOLUTION
4.0000 mg | Freq: Once | INTRAMUSCULAR | Status: AC
Start: 2023-06-25 — End: 2023-06-25
  Administered 2023-06-25: 4 mg via INTRAVENOUS

## 2023-06-25 MED ORDER — ONDANSETRON HCL (PF) 4 MG/2 ML INJECTION SOLUTION
INTRAMUSCULAR | Status: AC
Start: 2023-06-25 — End: 2023-06-25
  Filled 2023-06-25: qty 2

## 2023-06-25 MED ORDER — MIDAZOLAM 5 MG/ML INJECTION WRAPPER
2.0000 mg | Freq: Once | INTRAMUSCULAR | Status: DC | PRN
Start: 2023-06-25 — End: 2023-06-25
  Administered 2023-06-25: 2 mg via INTRAVENOUS

## 2023-06-25 MED ORDER — HYDROCODONE 5 MG-ACETAMINOPHEN 325 MG TABLET
1.0000 | ORAL_TABLET | ORAL | Status: DC | PRN
Start: 2023-06-25 — End: 2023-06-25
  Administered 2023-06-25: 1 via ORAL
  Filled 2023-06-25: qty 1

## 2023-06-25 MED ORDER — FAMOTIDINE (PF) 20 MG/2 ML INTRAVENOUS SOLUTION
INTRAVENOUS | Status: AC
Start: 2023-06-25 — End: 2023-06-25
  Filled 2023-06-25: qty 2

## 2023-06-25 MED ORDER — PROCHLORPERAZINE EDISYLATE 10 MG/2 ML (5 MG/ML) INJECTION SOLUTION
5.0000 mg | Freq: Once | INTRAMUSCULAR | Status: DC | PRN
Start: 2023-06-25 — End: 2023-06-25

## 2023-06-25 MED ORDER — FENTANYL (PF) 50 MCG/ML INJECTION WRAPPER
25.0000 ug | INJECTION | INTRAMUSCULAR | Status: DC | PRN
Start: 2023-06-25 — End: 2023-06-25

## 2023-06-25 MED ORDER — IPRATROPIUM 0.5 MG-ALBUTEROL 3 MG (2.5 MG BASE)/3 ML NEBULIZATION SOLN
3.0000 mL | INHALATION_SOLUTION | Freq: Once | RESPIRATORY_TRACT | Status: DC | PRN
Start: 2023-06-25 — End: 2023-06-25

## 2023-06-25 MED ORDER — HYDROCODONE 5 MG-ACETAMINOPHEN 325 MG TABLET
1.0000 | ORAL_TABLET | ORAL | 0 refills | Status: DC | PRN
Start: 2023-06-25 — End: 2023-07-10

## 2023-06-25 MED ORDER — ONDANSETRON HCL (PF) 4 MG/2 ML INJECTION SOLUTION
4.0000 mg | Freq: Three times a day (TID) | INTRAMUSCULAR | Status: DC | PRN
Start: 2023-06-25 — End: 2023-06-25

## 2023-06-25 MED ORDER — PHENAZOPYRIDINE 200 MG TABLET
200.0000 mg | ORAL_TABLET | Freq: Three times a day (TID) | ORAL | 1 refills | Status: DC | PRN
Start: 2023-06-25 — End: 2023-09-04

## 2023-06-25 MED ORDER — MORPHINE 4 MG/ML INJECTION WRAPPER
1.0000 mg | INJECTION | INTRAMUSCULAR | Status: DC | PRN
Start: 2023-06-25 — End: 2023-06-25

## 2023-06-25 MED ORDER — LEVOFLOXACIN 500 MG/100 ML IN 5 % DEXTROSE INTRAVENOUS PIGGYBACK
500.0000 mg | INJECTION | Freq: Once | INTRAVENOUS | Status: AC
Start: 2023-06-25 — End: 2023-06-25
  Administered 2023-06-25: 500 mg via INTRAVENOUS

## 2023-06-25 SURGICAL SUPPLY — 23 items
ADAPTER CATH 9FR SLFSL FEMALE LL CKFL STRL DISP (UROLOGICAL SUPPLIES) ×1 IMPLANT
BAG DRAIN STRL SKTRN CSTL (UROLOGICAL SUPPLIES) ×1 IMPLANT
BASKET STONE RTRVL 120CM 12MM 1.9FR 0TP URET NITINOL 4WR FLAT DIST SURF KNT TPLS STRL LF  DISP (UROLOGICAL SUPPLIES) ×1 IMPLANT
CATH URET SOF-FLX 5FR 70CM OP-EN ACPT .04IN GW (UROLOGICAL SUPPLIES) ×1 IMPLANT
DETERGENT INSTR 22OZ TRNSPT GEL RINSE FREE NEUT PH PREKLENZ CLR PLSNT LF (MISCELLANEOUS PT CARE ITEMS) ×1 IMPLANT
FIBER LASER 272UM MASTERPULSE ROMETER SYS SURG POWER MULTI GEN STRL DISP MASRERPULSE (SURGICAL LASER SUPPLIES) ×1 IMPLANT
GLOVE SURG 7.5 LF  PF BEAD CUF STRL CRM 11.8IN PROTEXIS PI PLISPRN THK9.1 MIL (GLOVES AND ACCESSORIES) ×2 IMPLANT
GOWN SURG 2XL XLNG LGTH L3 HKLP CLSR RGLN SLEEVE TWL STRL LF  DISP GRN AERO BLU PRFRM FBRC (DRAPE/PACKS/SHEETS/OR TOWEL) ×1 IMPLANT
GOWN SURG LRG STD LGTH L3 HKLP CLSR RGLN SLEEVE TWL STRL LF  DISP GRN AERO BLU PRFRM FBRC (DRAPE/PACKS/SHEETS/OR TOWEL) ×2 IMPLANT
GW URO .035IN 150CM 3CM SENSOR STR FLX TP RADOPQ NITINOL SS HDRPH PTFE URET LF (UROLOGICAL SUPPLIES) ×1 IMPLANT
GW URO .035IN 150CM 3CM ZIPWR STR RADOPQ STD SHAFT NITINOL HDRPH URET LF (UROLOGICAL SUPPLIES) ×1 IMPLANT
LABEL MED CORRECT MED LABELING SYS 4 FLG 2 SHEET 24 PRPRNT STRL (MED SURG SUPPLIES) ×1 IMPLANT
MAT INSTR TRY 44X36IN WTPRF BACKSHEET TPNX BLU (MISCELLANEOUS PT CARE ITEMS) ×1 IMPLANT
PACK SURG CYSTOSCOPIC T MAYO DRP I STRL DISP 84X60IN 30IN LF (CUSTOM TRAYS & PACK) ×1 IMPLANT
SET .241IN 96IN 2 NONVENT PIERCE PIN 2 PINCH CLAMP SIGHT CHAMBER Y CONN IRRG 85ML TUR IV STRL LF (IV TUBING & ACCESSORIES) ×1 IMPLANT
SOL IRRG 0.9% NACL 2000ML PRSV FR N-PYRG FLXB CONTAINR STRL LF (MEDICATIONS/SOLUTIONS) ×1 IMPLANT
SOL IV 0.9% NACL 1000ML STRL PRSV FR FLXB CONTAINR LF (MEDICATIONS/SOLUTIONS) ×1 IMPLANT
SPONGE GAUZE 4X4IN MDCHC COTTON 12 PLY TY 7 LF  STRL DISP (WOUND CARE SUPPLY) ×1 IMPLANT
STENT POLARIS 5FR 24CM URET 2 DRMTR BLADDER LOOP LOW PROF LRG INNER LUM PRCFLX HDR+ STRL LF  DISP (STENTS UROLOGICAL) ×1 IMPLANT
SYRINGE LL 10ML LF  STRL GRAD N-PYRG DEHP-FR PVC FREE MED DISP (MED SURG SUPPLIES) ×1 IMPLANT
SYRINGE LL 20ML STRL GRAD MED DISP (MED SURG SUPPLIES) ×1 IMPLANT
TOWEL 24X16IN COTTON BLU DISP SURG STRL LF (DRAPE/PACKS/SHEETS/OR TOWEL) ×1 IMPLANT
WATER STRL 1000ML PLASTIC PR BTL LF (MED SURG SUPPLIES) ×1 IMPLANT

## 2023-06-25 NOTE — OR Surgeon (Signed)
[facility]                                              OPERATIVE NOTE    Patient Name: [name] [name], [name] [name] Number: [id number]  Date of Service: 06/25/2023   Date of Birth: 1985-09-29    All elements must be documented.    Pre-Operative Diagnosis:  Right ureterolithiasis   Post-Operative Diagnosis:same  Procedure(s)/Description:  Cystoscopy, right retrograde pyelogram, right ureteroscopy, stone manipulation laser lithotripsy, right pyeloscopy, right ureteral stent insertion  Findings:  6 mm right proximal ureteral calculus     Patient prepped and draped in sterile fashion.  A well lubricated cystoscope was inserted into the urethra.  Systematic evaluation of the bladder did not reveal any tumors polyps diverticula or trabeculations.  A gentle right retrograde pyelogram was carried out which demonstrated a subtle filling defect in the proximal ureter consistent with the patient's ureteral calculus.  A zip wire was placed under fluoroscopic guidance and confirmed in appropriate position.  Adjacent to this a semi-rigid ureteroscope was inserted.  Along the course of the distal and mid ureter no abnormalities were identified.  Within the proximal ureter the stone was identified.  Using a holmium laser this was fragmented into manageable pieces which was extracted under direct vision using a ZeroTip Nitinol basket.  After I extracted the remaining stone proceeded along the ureter to the renal pelvis and no further stones were identified.  To confirm that all stones has been removed a Sensor wire was then placed under endoscopic guidance and the ureteroscope was then removed.  A flexible ureteroscope was then backloaded and inserted into the kidney.  Systematic evaluation of the kidney did not demonstrate any further stones.  I slowly removed the flexible ureteroscope and there was some mild ureteral edema at the site of the stone.  The decision was made to place a stent which was done so in  standard Seldinger fashion with satisfactory proximal curl noted on fluoroscopy.  No distal curl could be appreciated due to the polaris stent design.  The stones were removed and sent for analysis.  The patient's bladder was emptied.    Attending Surgeon: Arlana Hove  Assistant(s): NA    Anesthesia Type: General  Estimated Blood Loss:  Minimal  Blood Given: NA  Fluids Given: NS  Complications (unintended/unexpected/iatrogenic/accidental/inadvertent events):  NA  Characteristic Event (routinely expected or inherent to the difficulty/nature of the procedure): NA  Did the use of current and/or prior Anticoagulants impact the outcome of the case? No  Wound Class: Clean Contaminated Wounds -Respiratory, GI, Genital, or Urinary    Tubes: right 5Frx24cm prolaris stent  Drains: None  Specimens/ Cultures: right ureteral calculus  Implants: NA           Disposition: PACU - hemodynamically stable.  Condition: stable    Plan:  Stent removal in one week  Office f/u in 4-6 weeks        Arlana Hove, DO

## 2023-06-25 NOTE — Anesthesia Postprocedure Evaluation (Signed)
Anesthesia Post Op Evaluation    Patient: [name] [name]  Procedure(s):  CYSTOSCOPY; LASER LITHOTRIPSY; RIGHT URETEROSCOPY; RIGHT RETROGRADE PYELOGRAM; RIGHT STENT PLACEMENT    Last Vitals:Temperature: 36.6 C (97.8 F) (06/25/23 0852)  Heart Rate: 95 (06/25/23 0852)  BP (Non-Invasive): 122/80 (06/25/23 0852)  Respiratory Rate: 17 (06/25/23 0852)  SpO2: 100 % (06/25/23 0852)    No notable events documented.    Patient is sufficiently recovered from the effects of anesthesia to participate in the evaluation and has returned to their pre-procedure level.  Patient location during evaluation: PACU       Patient participation: complete - patient participated  Level of consciousness: awake and alert and responsive to verbal stimuli    Pain management: adequate  Airway patency: patent    Anesthetic complications: no  Cardiovascular status: acceptable  Respiratory status: acceptable  Hydration status: acceptable  Patient post-procedure temperature: Pt Normothermic   PONV Status: Absent

## 2023-06-25 NOTE — H&P (Signed)
[facility]  Urology Admission History and Physical      [name] [name], [name] [name], 37 y.o. female  Encounter Start Date:  06/25/2023  Inpatient Admission Date:    Date of Birth:  05-Jul-1986    PCP: Eyvonne Mechanic, PA-C    Information Obtained from: patient  Chief Complaint: ureterolithiasis       HPI:    [name] [name] is a pleasant 37 y.o. White female whom presents to the clinic for right hydronephrosis secondary to right 4-70mm ureterolithiasis noted on CT Abdomen/Pelvis w/o IV con 06/14/23.Patient with right flank pain that is much improved. Patient denies fevers, chills, nausea, vomiting, hematuria, dysuria, incontinence, dribbling, hesitancy, suprapubic pain, headaches, vision changes, shortness of breath, chest pain.Patient denies personal or family history of urinary malignancy. Patient denies occupational chemical exposure. Patient denies history of tobacco use.             ROS:  MUST comment on all "Abnormal" findings   ROS Other than ROS in the HPI, all other systems were negative.      PAST MEDICAL/ FAMILY/ SOCIAL HISTORY:       Past Medical History:   Diagnosis Date    Anxiety     Depression     Esophageal reflux     Iron deficiency anemia, unspecified     Syncope     Unspecified urinary incontinence     Weight loss due to medication          Allergies   Allergen Reactions    Cefuroxime  Other Adverse Reaction (Add comment)     Other Reaction(s): GI Intolerance    Vomiting? Unsure to just po med.    Cefuroxime Axetil      Vomiting? Unsure to just po med.    Bupropion Itching and  Other Adverse Reaction (Add comment)     Other Reaction(s): Not available     Medications Prior to Admission       Prescriptions    ascorbic acid, vitamin C, (VITAMIN C) 500 mg Oral Tablet    Take 1 Tablet (500 mg total) by mouth Once a day    cholecalciferol, vitamin D3, 25 mcg (1,000 unit) Oral Tablet    Take 1 Tablet (1,000 Units total) by mouth Once a day    cyclobenzaprine (FLEXERIL) 10 mg Oral Tablet    Take 1 Tablet (10  mg total) by mouth Three times a day as needed for Muscle spasms    Ibuprofen (MOTRIN) 800 mg Oral Tablet    Take 1 Tablet (800 mg total) by mouth Three times a day as needed for Pain    LORazepam (ATIVAN) 0.5 mg Oral Tablet    Take 1 Tablet (0.5 mg total) by mouth Twice per day as needed for Anxiety    magnesium oxide (MAG-OX) 400 mg Oral Tablet    Take 1 Tablet (400 mg total) by mouth Per instructions Tuesday,thursday    oxyCODONE-acetaminophen (PERCOCET) 5-325 mg Oral Tablet    Take 1 Tablet by mouth Every 6 hours as needed for Pain    pantoprazole (PROTONIX) 40 mg Oral Tablet, Delayed Release (E.C.)    Take 1 Tablet (40 mg total) by mouth Once a day    polysaccharide iron complex (POLY-IRON) 150 mg iron Oral Capsule    Take 1 Capsule (150 mg total) by mouth Twice daily    Patient taking differently:  Take 1 Capsule (150 mg total) by mouth Every Monday, Wednesday and Friday    prochlorperazine (COMPAZINE)  5 mg Oral Tablet    Take 1 Tablet (5 mg total) by mouth Every 6 hours as needed for Nausea/Vomiting    solifenacin (VESICARE) 5 mg Oral Tablet    Take 1 Tablet (5 mg total) by mouth Once a day    tamsulosin (FLOMAX) 0.4 mg Oral Capsule    Take 1 Capsule (0.4 mg total) by mouth Every evening after dinner    Patient not taking:  Reported on 06/25/2023    tirzepatide, weight loss, (ZEPBOUND) 7.5 mg/0.5 mL Subcutaneous Pen Injector    Inject 0.5 mL (7.5 mg total) under the skin Every 7 days Indications: cardiovascular/ vasovascular prevention as recommended by cardiologist    zinc sulfate (ZINCATE) 50 mg zinc (220 mg) Oral Capsule    Take 1 Capsule (50 mg total) by mouth Once a day           LR premix infusion, , Intravenous, Continuous  NS flush syringe, 3 mL, Intracatheter, Q8HRS  NS flush syringe, 3 mL, Intracatheter, Q1H PRN      Past Surgical History:   Procedure Laterality Date    HX APPENDECTOMY      HX CHOLECYSTECTOMY      HX SALPINGECTOMY           Social History     Tobacco Use    Smoking status: Never     Smokeless tobacco: Never   Vaping Use    Vaping status: Never Used   Substance Use Topics    Alcohol use: Never    Drug use: Never       Gen: NAD, alert  Pulm: unlabored at rest  CV: palpable pulses  Abd: soft, Nt/ND  GU: no suprapubic tenderness, no CVAT      Assessment & Plan:   There are no active hospital problems to display for this patient.      Right Hydronephrosis secondary to right 4-56mm ureterolithiasis  I discussed the differential diagnosis, pathophysiology and nature of urolithiasis and recurrent stone disease.  Patient was counseled on available treatment options in the management of their calculus.  Conservative, medical expulsive therapy consisting of alpha-blocker therapy, analgesia and directed fluids which is generally reserved for ureteral stones <5 mm without complicating factors (infection, intractable pain/nausea)  The patient was counseled regarding treatment options for urinary calculi including observation, extracorporeal shockwave lithotripsy (ESWL), ureteroscopic extraction/intracorporeal laser lithotripsy, percutaneous nephrolithotomy (PCNL), or open ureterolithotomy/nephrolithotomy. The risks, advantages, and disadvantages of each were discussed.   Patient understands that the risks include but are not limited to bleeding, infection, damage to adjacent tissues, renal fracture/contusion/hemorrhage, anesthesia risks, loss of the kidney, steinstrasse, therapeutic failure, possible need for post-operative drains/stents, need for additional procedures.  After further discussion, patient has elected to undergo cystoscopy, ureteroscopy with laser lithotripsy, stone manipulation and ureteral stent insertion at the next available date;  patient understands that more emergent intervention will be indicated should they develop fevers/urinary tract infection, intractable pain and/or nausea      Arlana Hove, DO

## 2023-06-25 NOTE — Anesthesia Preprocedure Evaluation (Signed)
ANESTHESIA PRE-OP EVALUATION  Planned Procedure: CYSTOSCOPY; LASER LITHOTRIPSY; RIGHT URETEROSCOPY; RIGHT RETROGRADE PYELOGRAM; RIGHT STENT PLACEMENT (Right: Ureter)  Review of Systems    PONV       patient summary reviewed  nursing notes reviewed        Pulmonary  negative pulmonary ROS,    Cardiovascular  negative cardio ROS,   No peripheral edema,  Exercise Tolerance: > or = 4 METS        GI/Hepatic/Renal    GERD, well controlled and kidney stones        Endo/Other    obesity,      Neuro/Psych/MS    anxiety, depression     Cancer    negative hematology/oncology ROS,               Physical Assessment      Airway       Mallampati: II    TM distance: >3 FB    Neck ROM: full  Mouth Opening: good.            Dental       Dentition intact             Pulmonary    Breath sounds clear to auscultation  (-) no rhonchi, no decreased breath sounds, no wheezes, no rales and no stridor     Cardiovascular    Rhythm: regular  Rate: Normal  (-) no friction rub, carotid bruit is not present, no peripheral edema and no murmur     Other findings          Plan  ASA 2     Planned anesthesia type: general     general anesthesia with endotracheal tube intubation      PONV Plan:  I plan to administer pharmcologic prophalaxis antiemetics                  Anesthesia issues/risks discussed are: Dental Injuries, Stroke, Aspiration, Cardiac Events/MI, Intraoperative Awareness/ Recall and Sore Throat.  Anesthetic plan and risks discussed with patient  signed consent obtained          Patient's NPO status is appropriate for Anesthesia.           Plan discussed with CRNA.          Urine Pregnancy Results: Negative

## 2023-06-25 NOTE — Anesthesia Transfer of Care (Signed)
ANESTHESIA TRANSFER OF CARE   [name] [name] is a 37 y.o. ,female, Weight: 84.8 kg (187 lb)   had Procedure(s):  CYSTOSCOPY; LASER LITHOTRIPSY; RIGHT URETEROSCOPY; RIGHT RETROGRADE PYELOGRAM; RIGHT STENT PLACEMENT  performed  06/25/23   Primary Service: Arlana Hove, DO    Past Medical History:   Diagnosis Date    Anxiety     Depression     Esophageal reflux     Iron deficiency anemia, unspecified     Syncope     Unspecified urinary incontinence     Weight loss due to medication       Allergy History as of 06/25/23       CEFUROXIME AXETIL         Noted Status Severity Type Reaction    12/07/21 0954 Walker Shadow, LPN 16/10/96 Active       Comments: Vomiting? Unsure to just po med.               BUPROPION         Noted Status Severity Type Reaction    06/24/23 1145 Delight Hoh, RN 12/20/21 Active Low  Itching,  Other Adverse Reaction (Add comment)    Comments: Other Reaction(s): Not available     05/20/22 0750 Blima Rich, RN 12/20/21 Active Low  Itching    12/20/21 1030 Walker Shadow, [place] 04/54/09 Active                 CEFUROXIME         Noted Status Severity Type Reaction    06/24/23 1145 Delight Hoh, RN 03/17/15 Active    Other Adverse Reaction (Add comment)    Comments: Other Reaction(s): GI Intolerance    Vomiting? Unsure to just po med.     09/05/22 1315 Gildardo Griffes, MA 02/24/17 Active                     I completed my transfer of care / handoff to the receiving personnel during which we discussed:  Access, Airway, All key/critical aspects of case discussed, Analgesia, Antibiotics, Expectation of post procedure, Fluids/Product, Gave opportunity for questions and acknowledgement of understanding, Labs and PMHx  Report given to: Abel Presto, RN    Post Location: PACU                                                             Last OR Temp: Temperature: 36.6 C (97.8 F)  ABG:  POTASSIUM   Date Value Ref Range Status   06/14/2023 3.8 3.5 - 5.1 mmol/L Final     KETONES    Date Value Ref Range Status   06/14/2023 40 (A) Negative mg/dL Final     CALCIUM   Date Value Ref Range Status   06/14/2023 9.1 8.5 - 10.1 mg/dL Final     Calculated P Axis   Date Value Ref Range Status   05/20/2022 63 degrees Final     Calculated R Axis   Date Value Ref Range Status   05/20/2022 94 degrees Final     Calculated T Axis   Date Value Ref Range Status   05/20/2022 45 degrees Final     Airway:* No LDAs found *  Blood pressure 122/80, pulse 95, temperature 36.6 C (97.8 F), resp. rate 17, height  1.6 m (5\' 3" ), weight 84.8 kg (187 lb), SpO2 100%.

## 2023-06-26 ENCOUNTER — Other Ambulatory Visit (INDEPENDENT_AMBULATORY_CARE_PROVIDER_SITE_OTHER): Payer: Self-pay | Admitting: Student in an Organized Health Care Education/Training Program

## 2023-06-26 MED ORDER — DIAZEPAM 2 MG TABLET
2.0000 mg | ORAL_TABLET | Freq: Four times a day (QID) | ORAL | 0 refills | Status: DC | PRN
Start: 2023-06-26 — End: 2023-09-04

## 2023-06-29 ENCOUNTER — Emergency Department (HOSPITAL_COMMUNITY): Payer: BC Managed Care – PPO

## 2023-06-29 ENCOUNTER — Other Ambulatory Visit: Payer: Self-pay

## 2023-06-29 ENCOUNTER — Emergency Department
Admission: EM | Admit: 2023-06-29 | Discharge: 2023-06-29 | Disposition: A | Discharge status: Home / Self Care | Payer: BC Managed Care – PPO | Attending: NURSE PRACTITIONER | Admitting: NURSE PRACTITIONER

## 2023-06-29 ENCOUNTER — Encounter (HOSPITAL_COMMUNITY): Payer: Self-pay

## 2023-06-29 DIAGNOSIS — R112 Nausea with vomiting, unspecified: Secondary | ICD-10-CM | POA: Insufficient documentation

## 2023-06-29 DIAGNOSIS — Z87442 Personal history of urinary calculi: Secondary | ICD-10-CM | POA: Insufficient documentation

## 2023-06-29 DIAGNOSIS — N390 Urinary tract infection, site not specified: Secondary | ICD-10-CM | POA: Insufficient documentation

## 2023-06-29 DIAGNOSIS — R1030 Lower abdominal pain, unspecified: Secondary | ICD-10-CM | POA: Insufficient documentation

## 2023-06-29 DIAGNOSIS — R109 Unspecified abdominal pain: Secondary | ICD-10-CM | POA: Insufficient documentation

## 2023-06-29 LAB — URINALYSIS, MACROSCOPIC
BILIRUBIN: NEGATIVE mg/dL
BLOOD: 1 mg/dL — AB
GLUCOSE: NEGATIVE mg/dL
KETONES: 10 mg/dL — AB
LEUKOCYTES: 250 WBCs/uL — AB
NITRITE: NEGATIVE
PH: 6 (ref 5.0–9.0)
PROTEIN: 200 mg/dL — AB
SPECIFIC GRAVITY: 1.019 (ref 1.002–1.030)
UROBILINOGEN: NORMAL mg/dL

## 2023-06-29 LAB — COMPREHENSIVE METABOLIC PANEL, NON-FASTING
ALBUMIN/GLOBULIN RATIO: 1.5 — ABNORMAL HIGH (ref 0.8–1.4)
ALBUMIN: 4.4 g/dL (ref 3.5–5.7)
ALKALINE PHOSPHATASE: 64 U/L (ref 34–104)
ALT (SGPT): 14 U/L (ref 7–52)
ANION GAP: 8 mmol/L (ref 4–13)
AST (SGOT): 24 U/L (ref 13–39)
BILIRUBIN TOTAL: 0.7 mg/dL (ref 0.3–1.0)
BUN/CREA RATIO: 14 (ref 6–22)
BUN: 14 mg/dL (ref 7–25)
CALCIUM, CORRECTED: 9.7 mg/dL (ref 8.9–10.8)
CALCIUM: 10 mg/dL (ref 8.6–10.3)
CHLORIDE: 106 mmol/L (ref 98–107)
CO2 TOTAL: 25 mmol/L (ref 21–31)
CREATININE: 1.01 mg/dL (ref 0.60–1.30)
ESTIMATED GFR: 74 mL/min/{1.73_m2} (ref >59)
GLOBULIN: 2.9 (ref 2.0–3.5)
GLUCOSE: 95 mg/dL (ref 74–109)
OSMOLALITY, CALCULATED: 278 mosm/kg (ref 270–290)
POTASSIUM: 3.8 mmol/L (ref 3.5–5.1)
PROTEIN TOTAL: 7.3 g/dL (ref 6.4–8.9)
SODIUM: 139 mmol/L (ref 136–145)

## 2023-06-29 LAB — CBC WITH DIFF
BASOPHIL #: 0 10*3/uL (ref 0.00–0.10)
BASOPHIL %: 0 % (ref 0–1)
EOSINOPHIL #: 0.1 10*3/uL (ref 0.00–0.50)
EOSINOPHIL %: 2 % (ref 1–7)
HCT: 41.9 % (ref 31.2–41.9)
HGB: 14.8 g/dL — ABNORMAL HIGH (ref 10.9–14.3)
LYMPHOCYTE #: 2.8 10*3/uL (ref 1.00–3.00)
LYMPHOCYTE %: 30 % (ref 16–44)
MCH: 30.6 pg (ref 24.7–32.8)
MCHC: 35.2 g/dL (ref 32.3–35.6)
MCV: 87 fL (ref 75.5–95.3)
MONOCYTE #: 0.7 10*3/uL (ref 0.30–1.00)
MONOCYTE %: 7 % (ref 5–13)
MPV: 8.8 fL (ref 7.9–10.8)
NEUTROPHIL #: 5.5 10*3/uL (ref 1.85–7.80)
NEUTROPHIL %: 61 % (ref 43–77)
PLATELETS: 274 10*3/uL (ref 140–440)
RBC: 4.82 10*6/uL (ref 3.63–4.92)
RDW: 12.2 % — ABNORMAL LOW (ref 12.3–17.7)
WBC: 9.1 10*3/uL (ref 3.8–11.8)

## 2023-06-29 LAB — URINALYSIS, MICROSCOPIC
RBCS: 2835 /[HPF] — ABNORMAL HIGH (ref <4)
WBCS: 202 /[HPF] — ABNORMAL HIGH (ref <6)

## 2023-06-29 LAB — PTT (PARTIAL THROMBOPLASTIN TIME): APTT: 33.5 s (ref 25.0–38.0)

## 2023-06-29 LAB — PT/INR
INR: 0.99 (ref 0.84–1.10)
PROTHROMBIN TIME: 11.6 s (ref 9.8–12.7)

## 2023-06-29 LAB — LACTIC ACID LEVEL W/ REFLEX FOR LEVEL >2.0: LACTIC ACID: 1 mmol/L (ref 0.5–2.2)

## 2023-06-29 MED ORDER — MORPHINE 4 MG/ML INJECTION WRAPPER
4.0000 mg | INJECTION | INTRAMUSCULAR | Status: AC
Start: 2023-06-29 — End: 2023-06-29
  Administered 2023-06-29: 4 mg via INTRAVENOUS

## 2023-06-29 MED ORDER — LEVOFLOXACIN 500 MG/100 ML IN 5 % DEXTROSE INTRAVENOUS PIGGYBACK
INJECTION | INTRAVENOUS | Status: AC
Start: 2023-06-29 — End: 2023-06-29
  Filled 2023-06-29: qty 100

## 2023-06-29 MED ORDER — SODIUM CHLORIDE 0.9 % (FLUSH) INJECTION SYRINGE
3.0000 mL | INJECTION | INTRAMUSCULAR | Status: DC | PRN
Start: 2023-06-29 — End: 2023-06-29

## 2023-06-29 MED ORDER — OXYCODONE-ACETAMINOPHEN 5 MG-325 MG TABLET
1.0000 | ORAL_TABLET | Freq: Two times a day (BID) | ORAL | 0 refills | Status: DC | PRN
Start: 2023-06-29 — End: 2023-07-28

## 2023-06-29 MED ORDER — CIPROFLOXACIN 500 MG TABLET
500.0000 mg | ORAL_TABLET | Freq: Two times a day (BID) | ORAL | 0 refills | Status: DC
Start: 2023-06-29 — End: 2023-07-10

## 2023-06-29 MED ORDER — ONDANSETRON HCL (PF) 4 MG/2 ML INJECTION SOLUTION
4.0000 mg | INTRAMUSCULAR | Status: AC
Start: 2023-06-29 — End: 2023-06-29
  Administered 2023-06-29: 4 mg via INTRAVENOUS

## 2023-06-29 MED ORDER — SODIUM CHLORIDE 0.9 % (FLUSH) INJECTION SYRINGE
3.0000 mL | INJECTION | Freq: Three times a day (TID) | INTRAMUSCULAR | Status: DC
Start: 2023-06-29 — End: 2023-06-29

## 2023-06-29 MED ORDER — OXYCODONE 5 MG TABLET
5.0000 mg | ORAL_TABLET | ORAL | Status: AC
Start: 2023-06-29 — End: 2023-06-29
  Administered 2023-06-29: 5 mg via ORAL

## 2023-06-29 MED ORDER — LEVOFLOXACIN 500 MG/100 ML IN 5 % DEXTROSE INTRAVENOUS PIGGYBACK
500.0000 mg | INJECTION | INTRAVENOUS | Status: AC
Start: 2023-06-29 — End: 2023-06-29
  Administered 2023-06-29: 0 mg via INTRAVENOUS
  Administered 2023-06-29: 500 mg via INTRAVENOUS

## 2023-06-29 MED ORDER — ONDANSETRON 4 MG DISINTEGRATING TABLET
4.0000 mg | ORAL_TABLET | Freq: Three times a day (TID) | ORAL | 0 refills | Status: DC | PRN
Start: 2023-06-29 — End: 2023-09-04

## 2023-06-29 MED ORDER — OXYCODONE 5 MG TABLET
ORAL_TABLET | ORAL | Status: AC
Start: 2023-06-29 — End: 2023-06-29
  Filled 2023-06-29: qty 1

## 2023-06-29 MED ORDER — NAPROXEN 500 MG TABLET
500.0000 mg | ORAL_TABLET | Freq: Two times a day (BID) | ORAL | 0 refills | Status: DC
Start: 2023-06-29 — End: 2023-09-04

## 2023-06-29 MED ORDER — ONDANSETRON HCL (PF) 4 MG/2 ML INJECTION SOLUTION
INTRAMUSCULAR | Status: AC
Start: 2023-06-29 — End: 2023-06-29
  Filled 2023-06-29: qty 2

## 2023-06-29 MED ORDER — MORPHINE 4 MG/ML INJECTION WRAPPER
INJECTION | INTRAMUSCULAR | Status: AC
Start: 2023-06-29 — End: 2023-06-29
  Filled 2023-06-29: qty 1

## 2023-06-29 NOTE — Discharge Instructions (Signed)
Continue to monitor symptoms. Take medications as prescribed.  Make sure you are staying hydrated with plenty of fluids.  Get plenty of rest.  Follow-up with your family doctor within 48-72 hours.  Return to ER immediately for any new or worsening symptoms.    Please ensure all questions or concerns are addressed prior to leaving the hospital. We want to make sure your concerns are addressed to make sure you are as safe and healthy as possible. By leaving the hospital, it is understood you are in agreement with your treatment plan.    Please discuss all medications with your pharmacist to ensure there are no concerns of interactions.    Thank you for allowing [place] to be part of your care.

## 2023-06-29 NOTE — ED Triage Notes (Signed)
Urinary stent to right placed on Wednesday by Dr Dario Guardian.  Cont to have back pain with nausea and dizziness.  Reports last "good pee" was at 1700 and has gross hematuria

## 2023-06-29 NOTE — ED Provider Notes (Signed)
[facility]  Emergency Department  Advanced Practice Provider Note      CHIEF COMPLAINT  Chief Complaint   Patient presents with    Urinary Retention    Blood in urine    Flank Pain     HISTORY OF PRESENT ILLNESS  [name] [name], date of birth 1986-01-29, is a 37 y.o. female who presented to the Emergency Department.    Patient is a 37 year old female, with a history of renal calculi, recent ureteral stent placement, appendectomy and cholecystectomy, who presents to the ED with complaint of lower abdominal pain and right flank pain.  Patient reports that she did have a stent placed on Wednesday due to a kidney stone.  Started having increased pain on Friday.  Has been taking hydrocodone and AZO with little to no relief.  Patient describes pain as a constant ache and intermittent sharp.  Denies any exacerbating or relieving factors.  Currently rates pain a 10/10.  Also reporting some dysuria and hematuria.  Denies any recent fever or chills.  Is reporting lower abdominal pain and right flank pain.  Also reporting some nausea and vomiting.  Denies any diarrhea.  Reports she does have an appointment scheduled on Monday for stent removal with Dr. Fredonia Highland    PAST MEDICAL/SURGICAL/FAMILY/SOCIAL HISTORY  Past Medical History:   Diagnosis Date    Anxiety     Depression     Esophageal reflux     Iron deficiency anemia, unspecified     Syncope     Unspecified urinary incontinence     Weight loss due to medication        Past Surgical History:   Procedure Laterality Date    HX APPENDECTOMY      HX CHOLECYSTECTOMY      HX SALPINGECTOMY         Family Medical History:       Problem Relation (Age of Onset)    Arthritis-osteo Father    Coronary Artery Disease Mother    Diabetes type II Maternal Grandmother    Hypothyroidism Sister    Kidney Stones Maternal Grandmother          Social History     Socioeconomic History    Marital status: Married   Tobacco Use    Smoking status: Never    Smokeless tobacco: Never    Vaping Use    Vaping status: Never Used   Substance and Sexual Activity    Alcohol use: Never    Drug use: Never     Social Determinants of Health     Transportation Needs: No Transportation Needs (02/03/2023)    [facility]    Transportation     In the past 12 months, has lack of reliable transportation kept you from medical appointments, meetings, work or from getting things needed for daily living? : No   Housing Stability: Low Risk  (02/03/2023)    [facility]    Housing Stability Vital Sign     What is your living situation today?: I have a steady place to live     Think about the place you live. Do you have problems with any of the following? Choose all that apply:: None/None on this list      ALLERGIES  Allergies   Allergen Reactions    Cefuroxime  Other Adverse Reaction (Add comment)     Other Reaction(s): GI Intolerance    Vomiting? Unsure to just po med.  Cefuroxime Axetil      Vomiting? Unsure to just po med.    Bupropion Itching and  Other Adverse Reaction (Add comment)     Other Reaction(s): Not available           PHYSICAL EXAM  VITAL SIGNS:  Filed Vitals:    06/29/23 0132   BP: 118/84   Pulse: 80   Resp: 20   Temp: 36.6 C (97.8 F)   SpO2: 100%     Constitutional: Awake.  Average body weight. Appears uncomfortable  Cardiovascular: Regular rate. S1, S2 with no murmur or gallop heard. No swelling to extremities  Pulmonary/Chest: Breath sounds clear and equal bilaterally. No wheezes, rales or chest tenderness. No respiratory distress.   Abdominal: Bowel sound normal. Abdomen soft, RLQ/LLQ tenderness without guarding. R CVA tenderness      Musculoskeletal: No tenderness or deformity. Normal muscle tone and strength.   Skin: warm and dry. No rash, redness, or bruising  Psychiatric: normal mood and affect. Behavior is normal.   Neurological: Alert, oriented. Normal gait. No focal weakness noted. No sensory deficit    Nursing notes reviewed.      DIAGNOSTICS  Labs:  Labs listed below were reviewed and interpreted by me.  Results for orders placed or performed during the hospital encounter of 06/29/23   COMPREHENSIVE METABOLIC PANEL, NON-FASTING   Result Value Ref Range    SODIUM 139 136 - 145 mmol/L    POTASSIUM 3.8 3.5 - 5.1 mmol/L    CHLORIDE 106 98 - 107 mmol/L    CO2 TOTAL 25 21 - 31 mmol/L    ANION GAP 8 4 - 13 mmol/L    BUN 14 7 - 25 mg/dL    CREATININE 1.30 8.65 - 1.30 mg/dL    BUN/CREA RATIO 14 6 - 22    ESTIMATED GFR 74 >59 mL/min/1.31m^2    ALBUMIN 4.4 3.5 - 5.7 g/dL    CALCIUM 78.4 8.6 - 69.6 mg/dL    GLUCOSE 95 74 - 295 mg/dL    ALKALINE PHOSPHATASE 64 34 - 104 U/L    ALT (SGPT) 14 7 - 52 U/L    AST (SGOT) 24 13 - 39 U/L    BILIRUBIN TOTAL 0.7 0.3 - 1.0 mg/dL    PROTEIN TOTAL 7.3 6.4 - 8.9 g/dL    ALBUMIN/GLOBULIN RATIO 1.5 (H) 0.8 - 1.4    OSMOLALITY, CALCULATED 278 270 - 290 mOsm/kg    CALCIUM, CORRECTED 9.7 8.9 - 10.8 mg/dL    GLOBULIN 2.9 2.0 - 3.5   LACTIC ACID LEVEL W/ REFLEX FOR LEVEL >2.0   Result Value Ref Range    LACTIC ACID 1.0 0.5 - 2.2 mmol/L   PT/INR   Result Value Ref Range    PROTHROMBIN TIME 11.6 9.8 - 12.7 seconds    INR 0.99 0.84 - 1.10   PTT (PARTIAL THROMBOPLASTIN TIME)   Result Value Ref Range    APTT 33.5 25.0 - 38.0 seconds   CBC WITH DIFF   Result Value Ref Range    WBC 9.1 3.8 - 11.8 x10^3/uL    RBC 4.82 3.63 - 4.92 x10^6/uL    HGB 14.8 (H) 10.9 - 14.3 g/dL    HCT 28.4 13.2 - 44.0 %    MCV 87.0 75.5 - 95.3 fL    MCH 30.6 24.7 - 32.8 pg    MCHC 35.2 32.3 - 35.6 g/dL    RDW 10.2 (L) 72.5 - 17.7 %    PLATELETS 274 140 -  440 x10^3/uL    MPV 8.8 7.9 - 10.8 fL    NEUTROPHIL % 61 43 - 77 %    LYMPHOCYTE % 30 16 - 44 %    MONOCYTE % 7 5 - 13 %    EOSINOPHIL % 2 1 - 7 %    BASOPHIL % 0 0 - 1 %    NEUTROPHIL # 5.50 1.85 - 7.80 x10^3/uL    LYMPHOCYTE # 2.80 1.00 - 3.00 x10^3/uL    MONOCYTE # 0.70 0.30 - 1.00 x10^3/uL    EOSINOPHIL # 0.10 0.00 - 0.50 x10^3/uL    BASOPHIL # 0.00 0.00 - 0.10 x10^3/uL   URINALYSIS, MACROSCOPIC    Result Value Ref Range    COLOR Brown (A) Colorless, Light Yellow, Yellow    APPEARANCE Ex. Turbid (A) Clear    SPECIFIC GRAVITY 1.019 1.002 - 1.030    PH 6.0 5.0 - 9.0    LEUKOCYTES 250 (A) Negative, 100  WBCs/uL    NITRITE Negative Negative    PROTEIN 200 (A) Negative, 10 , 20  mg/dL    GLUCOSE Negative Negative, 30  mg/dL    KETONES 10 (A) Negative, Trace mg/dL    BILIRUBIN Negative Negative, 0.5 mg/dL    BLOOD 1.0 (A) Negative, 0.03 mg/dL    UROBILINOGEN Normal Normal mg/dL   URINALYSIS, MICROSCOPIC   Result Value Ref Range    BACTERIA Moderate (A) Negative /hpf    MUCOUS Moderate (A) Rare, Occasional, Few /hpf    RBCS 2,835 (H) <4 /hpf    WBCS 202 (H) <6 /hpf    WHITE BLOOD CELL CLUMP Many (A) (none) /hpf     Radiology:       ED COURSE/MEDICAL DECISION MAKING  Medications Administered in the ED   NS flush syringe (has no administration in time range)   NS flush syringe (has no administration in time range)   ondansetron (ZOFRAN) 2 mg/mL injection (has no administration in time range)   morphine 4 mg/mL injection (has no administration in time range)      ED Course as of 06/29/23 0305   Sun Jun 29, 2023   0237 WBC: 9.1  Normal. PMNs 61   0237 LACTIC ACID: 1.0  Normal   0244 CT ABDOMEN PELVIS WO IV CONTRAST  Right urethral stent with it is coiled in appropriately positioned in the right collecting system and bladder.  No evidence of hydronephrosis.   0250 CREATININE: 1.01  GFR 74 BUN 14   0305 LEUKOCYTES(!): 250  Nitrite negative. WBC's 202      Medical Decision Making  Patient is a 37 year old female, with a history of renal calculi, recent ureteral stent placement, appendectomy and cholecystectomy, who presents to the ED with complaint of lower abdominal pain and right flank pain.  Patient reports that she did have a stent placed on Wednesday due to a kidney stone.  Started having increased pain on Friday.  Has been taking hydrocodone and AZO with little to no relief.  Patient describes pain as a constant  ache and intermittent sharp.  Denies any exacerbating or relieving factors.  Currently rates pain a 10/10.  Also reporting some dysuria and hematuria.  Denies any recent fever or chills.  Is reporting lower abdominal pain and right flank pain.  Also reporting some nausea and vomiting.  Denies any diarrhea.  Reports she does have an appointment scheduled on Monday for stent removal with Dr. Fredonia Highland    Differential diagnosis includes but is not limited  to:  Sepsis, hydronephrosis, pyelonephritis, urinary tract infection, renal calculi, intra-abdominal abscess    Patient's white count is normal at 9.1, neutrophils 61.  Lactic acid is normal at 1.0.  Patient's renal function is normal with creatinine 1.01, GFR 74 and BUN 14. Coags are within normal range. Patient's CT of the abdomen pelvis shows right urethral stent with it is coiled end appropriately positioned in the right collecting system and bladder.  No evidence of hydronephrosis.    Patient reports her pain has decreased to an 8/10. Will re-medicate and continue to reassess.     Patient's urinalysis does show evidence of a urinary tract infection with 250 leukocytes, negative nitrites and over 200 white blood cells.  Patient has allergy to Ceftin listed. Was given Levaquin during surgery. Will be given another dose of Levaquin while in the ED and then discharged on Cipro. Patient to follow up with Dr Fredonia Highland on Monday as scheduled. Patient will be discharged with Naproxen, Oxycodone and Zofran to use as needed.     A comprehensive pain management plan for the patient was developed and discussed with the patient. Information from the Controlled Substance Monitoring Program was accessed.   Alternative treatments, non-pharmacological pain management options, were prescribed as a part of the comprehensive the pain management treatment plan.  The risks, benefits, and availability of these alternative treatment options were reviewed.  Non-opioid, pharmacological pain  management options, were prescribed Naproxen, and the risks and benefits of these options were reviewed.  Opioid pain management options were discussed, including the risks and benefits of these options.  Specifically, the patient was advised that opioids are highly addictive, even when taken as prescribed, that there is a risk of developing a physical or psychological dependence on the controlled substance, and that the risks of taking more opioids than prescribed, or mixing sedatives, benzodiazepines, or alcohol with opioids, can result in fatal respiratory depression.  It was determined that an opioid prescription medication is a necessary and appropriate part of the patient's comprehensive pain management plan.    The patient was advised they would be receiving a 3 day prescription for  Oxycodone at discharge.     The patient was also informed if prescription was lost or stolen, a new prescription would not be issued.   The patient and/or their guardian were given an opportunity to ask questions about the comprehensive pain management plan and their questions were answered.       Amount and/or Complexity of Data Reviewed  Labs: ordered.  Radiology: ordered. Decision-making details documented in ED Course.  ECG/medicine tests: independent interpretation performed.    Risk  Prescription drug management.  Parenteral controlled substances.      CLINICAL IMPRESSION  Clinical Impression   Lower abdominal pain (Primary)   Right flank pain   UTI (urinary tract infection)     DISPOSITION  Discharged       DISCHARGE MEDICATIONS  Current Discharge Medication List        START taking these medications    Details   naproxen (NAPROSYN) 500 mg Oral Tablet Take 1 Tablet (500 mg total) by mouth Twice daily with food  Qty: 30 Tablet, Refills: 0      ondansetron (ZOFRAN ODT) 4 mg Oral Tablet, Rapid Dissolve Take 1 Tablet (4 mg total) by mouth Every 8 hours as needed for Nausea/Vomiting  Qty: 12 Tablet, Refills: 0             Sherlie Ban- FNP-C, ENP-C 06/29/2023, 01:57  [facility]  Department of Emergency Medicine  [facility]    This note was partially generated using MModal Fluency Direct system, and there may be some incorrect words, spellings, and punctuation that were not noted in checking the note before saving.    -----

## 2023-06-30 ENCOUNTER — Encounter (HOSPITAL_COMMUNITY): Payer: Self-pay | Admitting: Student in an Organized Health Care Education/Training Program

## 2023-06-30 ENCOUNTER — Ambulatory Visit
Admission: RE | Admit: 2023-06-30 | Discharge: 2023-06-30 | Disposition: A | Discharge status: Home / Self Care | Payer: BC Managed Care – PPO | Source: Ambulatory Visit | Attending: Student in an Organized Health Care Education/Training Program | Admitting: Student in an Organized Health Care Education/Training Program

## 2023-06-30 ENCOUNTER — Other Ambulatory Visit: Payer: Self-pay

## 2023-06-30 ENCOUNTER — Encounter (HOSPITAL_COMMUNITY)
Admission: RE | Disposition: A | Discharge status: Home / Self Care | Payer: Self-pay | Source: Ambulatory Visit | Attending: Student in an Organized Health Care Education/Training Program

## 2023-06-30 DIAGNOSIS — Z87442 Personal history of urinary calculi: Secondary | ICD-10-CM | POA: Insufficient documentation

## 2023-06-30 DIAGNOSIS — Z466 Encounter for fitting and adjustment of urinary device: Secondary | ICD-10-CM | POA: Insufficient documentation

## 2023-06-30 SURGERY — CYSTOSCOPY WITH URETERAL STENT REMOVAL
Anesthesia: Local (Nurse-Monitored) | Laterality: Right | Wound class: Clean Contaminated Wounds-The respiratory, GI, Genital, or urinary

## 2023-06-30 SURGICAL SUPPLY — 4 items
CONV USE 65308 - DRAPE FNFLD ABS REINF 77X53IN 43528 PRXM LF  STRL DISP SURG SMS 44X23IN (DRAPE/PACKS/SHEETS/OR TOWEL) ×1
COVER 90X44IN ZN REINF BCK TBL FNFLD HALYARD BSC STRL EQP KC100 LF (DRAPE/PACKS/SHEETS/OR TOWEL) ×1 IMPLANT
CYSTOSCOPE FLEXIBLE ASCOPE 15.4IN X 2.2MM 16.2FR REVERSE DEFLECTION LEVER (SURGICAL INSTRUMENTS) ×1 IMPLANT
DRAPE FNFLD ABS REINF 77X53IN 43528 PRXM LF  STRL DISP SURG SMS 44X23IN (DRAPE/PACKS/SHEETS/OR TOWEL) ×1 IMPLANT

## 2023-06-30 NOTE — Discharge Instructions (Signed)
SURGICAL DISCHARGE INSTRUCTIONS     Dr. Fredonia Highland, Jomarie Longs, DO  performed your FLEXIBLE CYSTOSCOPY WITH RIGHT URETERAL STENT REMOVAL today at the [facility]    [facility]:  Monday through Friday from 8 a.m. - 4 p.m.: [phone]    For T&D: [phone]  Between 4 p.m. - 8 a.m., weekends and holidays:  Call ER [phone]    PLEASE SEE WRITTEN HANDOUTS AS DISCUSSED BY YOUR NURSE:  ureteral stent     SIGNS AND SYMPTOMS OF INFECTION   Be sure to watch for the following:  Increase in pain that is intense or severe.  Increase in fever for longer than 24 hours, or an increase that is higher than 101 degrees Fahrenheit (normal body temperature is 98 degrees Fahrenheit).   **CALL YOUR DOCTOR IF ONE OR MORE OF THESE SIGNS / SYMPTOMS SHOULD OCCUR.      REMEMBER   If you experience any difficulty breathing, chest pain, bleeding that you feel is excessive, persistent nausea or vomiting or for any other concerns:  Call your physician Dr.  Arlana Hove, DO   at [phone] . You may also ask to have the general doctor on call paged. They are available to you 24 hours a day.      SPECIAL INSTRUCTIONS / COMMENTS   Dr. Emilio Math office will call you with follow up appointment.  Drink extra non Caffeinated fluids today.    FOLLOW-UP APPOINTMENTS   Please call your surgeon's office at the number listed to schedule a date / time of return for follow-up.     Dr Arlana Hove [phone]

## 2023-06-30 NOTE — H&P (Signed)
[facility]  Urology Admission History and Physical      [name] [name], 37 y.o. female  Encounter Start Date:  06/30/2023  Inpatient Admission Date:    Date of Birth:  01-03-1986    PCP: Eyvonne Mechanic, PA-C    Information Obtained from: patient  Chief Complaint: ureterolithiasis       HPI:    [name] [name] is a 37 y.o., White female who recently underwent a right ureteroscopy for a ureteral calculus on June 25, 2023.  She presents today for stent removal.       ROS:  MUST comment on all "Abnormal" findings   ROS Other than ROS in the HPI, all other systems were negative.      PAST MEDICAL/ FAMILY/ SOCIAL HISTORY:       Past Medical History:   Diagnosis Date    Anxiety     Depression     Esophageal reflux     Iron deficiency anemia, unspecified     Syncope     Unspecified urinary incontinence     Weight loss due to medication          Allergies   Allergen Reactions    Cefuroxime  Other Adverse Reaction (Add comment)     Other Reaction(s): GI Intolerance    Vomiting? Unsure to just po med.    Cefuroxime Axetil      Vomiting? Unsure to just po med.    Bupropion Itching and  Other Adverse Reaction (Add comment)     Other Reaction(s): Not available     Medications Prior to Admission       Prescriptions    ascorbic acid, vitamin C, (VITAMIN C) 500 mg Oral Tablet    Take 1 Tablet (500 mg total) by mouth Once a day    cholecalciferol, vitamin D3, 25 mcg (1,000 unit) Oral Tablet    Take 1 Tablet (1,000 Units total) by mouth Once a day    ciprofloxacin HCl (CIPRO) 500 mg Oral Tablet    Take 1 Tablet (500 mg total) by mouth Twice daily for 5 days    cyclobenzaprine (FLEXERIL) 10 mg Oral Tablet    Take 1 Tablet (10 mg total) by mouth Three times a day as needed for Muscle spasms    diazePAM (VALIUM) 2 mg Oral Tablet    Take 1 Tablet (2 mg total) by mouth Every 6 hours as needed (bladder spasms)    HYDROcodone-acetaminophen (NORCO) 5-325 mg Oral Tablet    Take 1 Tablet by mouth Every 4 hours as needed for Pain  for up to 3 days    Ibuprofen (MOTRIN) 800 mg Oral Tablet    Take 1 Tablet (800 mg total) by mouth Three times a day as needed for Pain    LORazepam (ATIVAN) 0.5 mg Oral Tablet    Take 1 Tablet (0.5 mg total) by mouth Twice per day as needed for Anxiety    magnesium oxide (MAG-OX) 400 mg Oral Tablet    Take 1 Tablet (400 mg total) by mouth Per instructions Tuesday,thursday    naproxen (NAPROSYN) 500 mg Oral Tablet    Take 1 Tablet (500 mg total) by mouth Twice daily with food    ondansetron (ZOFRAN ODT) 4 mg Oral Tablet, Rapid Dissolve    Take 1 Tablet (4 mg total) by mouth Every 8 hours as needed for Nausea/Vomiting    oxyCODONE-acetaminophen (PERCOCET) 5-325 mg Oral Tablet    Take 1 Tablet by mouth Twice per  day as needed for Pain    pantoprazole (PROTONIX) 40 mg Oral Tablet, Delayed Release (E.C.)    Take 1 Tablet (40 mg total) by mouth Once a day    phenazopyridine (PYRIDIUM) 200 mg Oral Tablet    Take 1 Tablet (200 mg total) by mouth Three times a day as needed for Pain    polysaccharide iron complex (POLY-IRON) 150 mg iron Oral Capsule    Take 1 Capsule (150 mg total) by mouth Twice daily    Patient taking differently:  Take 1 Capsule (150 mg total) by mouth Every Monday, Wednesday and Friday    prochlorperazine (COMPAZINE) 5 mg Oral Tablet    Take 1 Tablet (5 mg total) by mouth Every 6 hours as needed for Nausea/Vomiting    solifenacin (VESICARE) 5 mg Oral Tablet    Take 1 Tablet (5 mg total) by mouth Once a day    tamsulosin (FLOMAX) 0.4 mg Oral Capsule    Take 1 Capsule (0.4 mg total) by mouth Every evening after dinner    tirzepatide, weight loss, (ZEPBOUND) 7.5 mg/0.5 mL Subcutaneous Pen Injector    Inject 0.5 mL (7.5 mg total) under the skin Every 7 days Indications: cardiovascular/ vasovascular prevention as recommended by cardiologist    zinc sulfate (ZINCATE) 50 mg zinc (220 mg) Oral Capsule    Take 1 Capsule (50 mg total) by mouth Once a day           No current facility-administered medications for  this encounter.    Past Surgical History:   Procedure Laterality Date    HX APPENDECTOMY      HX CHOLECYSTECTOMY      HX SALPINGECTOMY           Social History     Tobacco Use    Smoking status: Never    Smokeless tobacco: Never   Vaping Use    Vaping status: Never Used   Substance Use Topics    Alcohol use: Never    Drug use: Never       Gen: NAD, alert  Pulm: unlabored at rest  CV: palpable pulses  Abd: soft, Nt/ND  GU: no suprapubic tenderness, no CVAT      Assessment & Plan:   There are no active hospital problems to display for this patient.    Right ureterolithiasis s/p Right URS/LL/stent on 06/25/2023  Plan for stent removal today         Arlana Hove, DO

## 2023-06-30 NOTE — OR Surgeon (Signed)
[facility]                                              OPERATIVE NOTE    Patient Name: Kursten, Kruk Number: [id number]  Date of Service: 06/30/2023   Date of Birth: 1985/10/21    All elements must be documented.    Pre-Operative Diagnosis:right ureterolithiasis   Post-Operative Diagnosis:same  Procedure(s)/Description:flexible cystoscopy, right ureteral stent removal  Findings: right ureteral stent    Patient prepped and draped in sterile fashion. A well lubricated flexible cystoscope was inserted into the urethra. Upon entering the bladder the ureteral stent was immediately identified. A flexible grasper was used to grab the stent which was removed under direct vision without difficulty. Patient tolerated procedure well without untoward event.         Attending Surgeon: Arlana Hove  Assistant(s): NA    Anesthesia Type: Local (Nurse-Monitored)  Estimated Blood Loss:  Minimal  Blood Given: NA  Fluids Given: NS  Complications (unintended/unexpected/iatrogenic/accidental/inadvertent events):  NA  Characteristic Event (routinely expected or inherent to the difficulty/nature of the procedure): NA  Did the use of current and/or prior Anticoagulants impact the outcome of the case? No  Wound Class: Clean Contaminated Wounds -Respiratory, GI, Genital, or Urinary    Tubes: None  Drains: None  Specimens/ Cultures: NA  Implants: NA           Disposition: PACU - hemodynamically stable.  Condition: stable    Plan:  F/u as scheduled      Arlana Hove, DO

## 2023-07-01 LAB — URINE CULTURE,ROUTINE: URINE CULTURE: NO GROWTH

## 2023-07-02 LAB — STONE ANALYSIS W/ IMAGE: STONE WEIGHT: 0.015 g

## 2023-07-10 ENCOUNTER — Encounter (INDEPENDENT_AMBULATORY_CARE_PROVIDER_SITE_OTHER): Payer: Self-pay | Admitting: Physician Assistant

## 2023-07-10 ENCOUNTER — Other Ambulatory Visit: Payer: Self-pay

## 2023-07-10 ENCOUNTER — Ambulatory Visit: Payer: BC Managed Care – PPO | Attending: Physician Assistant | Admitting: Physician Assistant

## 2023-07-10 VITALS — BP 104/80 | HR 116 | Ht 63.0 in | Wt 193.0 lb

## 2023-07-10 DIAGNOSIS — F409 Phobic anxiety disorder, unspecified: Secondary | ICD-10-CM | POA: Insufficient documentation

## 2023-07-10 DIAGNOSIS — M5416 Radiculopathy, lumbar region: Secondary | ICD-10-CM | POA: Insufficient documentation

## 2023-07-10 DIAGNOSIS — M5126 Other intervertebral disc displacement, lumbar region: Secondary | ICD-10-CM | POA: Insufficient documentation

## 2023-07-10 DIAGNOSIS — Z566 Other physical and mental strain related to work: Secondary | ICD-10-CM | POA: Insufficient documentation

## 2023-07-10 DIAGNOSIS — F419 Anxiety disorder, unspecified: Secondary | ICD-10-CM | POA: Insufficient documentation

## 2023-07-10 DIAGNOSIS — M545 Low back pain, unspecified: Secondary | ICD-10-CM | POA: Insufficient documentation

## 2023-07-10 MED ORDER — KETOROLAC 30 MG/ML (1 ML) INJECTION SOLUTION
30.0000 mg | Freq: Once | INTRAMUSCULAR | Status: AC
Start: 2023-07-10 — End: 2023-07-10
  Administered 2023-07-10: 30 mg via INTRAMUSCULAR

## 2023-07-10 NOTE — Nursing Note (Signed)
Patient is in the office today with complaints of back pain.

## 2023-07-10 NOTE — Assessment & Plan Note (Signed)
To continue Ativan 0.5 mg b.i.d. p.r.n.  Also discussed relaxation methods
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
## 2023-07-15 NOTE — Progress Notes (Signed)
 Patient ID: [name] [name] is a 37 y.o. female.    Subjective    Patient returns to clinic as a follow up today. Last seen on 02/24/2023 at which time she underwent right L5-S1 TFESI for right L5 radiculitis secondary to L4-L5 posterior disc herniation.    Prior Treatments:  -02/24/2023: Right L5-S1 TFESI with dexamethasone    Objective  There were no vitals filed for this visit.   GENERAL: A/O x3, cooperative, NAD  PSYCH: Mood and affect appropriate.  SKIN: No rashes or lesions noted on limited skin exam.   PULM: Respirations regular, non-labored.  MSK: No deformities  NEURO: No focal weakness    Below labs were reviewed in the context of MDM:  No results found for: "CREATININE"    Imaging:   Lumbar MRI from Jan 06, 2023:  - Evidence of L4-L5 central disc herniation with some degree of bilateral lateral recess stenosis      Assessment  1.  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  2.  Right L5 radiculitis  3.  Elevated BMI of almost 40.      Plan  -Proceed with repeat right L5-S1 TFESI, see below procedure note      Patient was seen and examined with Dr.Wortman.  I personally obtained the history, examined the patient, and formulated the treatment plan as indicated above.  I agree with the above note and have modified it, where appropriate.   Larey Days., MD    PROCEDURE     1. A right L5-S1 transforaminal epidural injection under fluoroscopic guidance.     2. Use of fluoroscopy was required to insure adequate delivery of medication into the epidural space and around the spinal nerve.     The patient was monitored with continuous pulse oximetry during the procedure.     DIAGNOSIS/INDICATIONS FOR THE PROCEDURE: Patient with LBP and radiculitis. Due to their pain they were set up for this injection. For specific information please refer to their history and physical and request for this.  Patient has a history of L4-5 disc herniation and did well after previous epidural and due to recent  exacerbation was set up for this.    IMPRESSION/RESULTS: The patient tolerated the procedure well without any complications.  Hopefully show therapeutic improvement from this.    FOLLOW-UP APPOINTMENT: As needed as they live about 2 hours away.    INFORMED CONSENT: The patient understood the potential risks and benefits of the procedure which were explained to the patient prior to the procedure. The patient read and signed the consent stating complete understanding of this information and wished to proceed with the procedure. Ample time was given for any questions to be answered prior to the procedure. The risks of the procedure were explained including, but not limited to the risk of bleeding and/or infection into the epidural space or spine, nerve injury, nerve irritation, allergic or adverse reaction to medications, etc. No promises were given to any expected outcome.     PROCEDURE: A time out was initially performed. The patient was sterilely prepped and draped with a triple scrub of betadine solution in the prone position. Careful aseptic technique was used throughout the procedure. The neuroforamen was identified under fluoroscopic guidance using an oblique posterior-lateral approach. The skin and subcutaneous tissues were anesthetized with approximately 2-3 cc. of 1% Xylocaine. Then, a 25-gauge spinal needle was inserted down into the foramen. A small amount of Isovue M 200 (approximately 1-3 cc) was  infiltrated under real time fluoroscopy, which eventually demonstrated satisfactory spread along the spinal. No arterial or venous flow was noted during the injection of the contrast prior to injecting any steroid or anesthetic and no aspirate was noted. Spot films were taken. Then, a solution containing 1.5 cc. of Dexamethasone (10mg /cc, preservative free) mixed with 1.5 cc of 1% xylocaine preservative free was slowly infiltrated around the nerve and into the epidural space. There was good spread and eventual  washout of contrast. The needle was then removed, and a Band-Aid was applied if needed. The patient tolerated the procedure well without any complications.  Procedures performed Dr. Alma Friendly and myself.    DISCHARGE SUMMARY: The patient was monitored for at least 10 minutes where they remained stable without any evidence of complications. The patient was discharged with discharge instructions in stable condition. If the patient has any problems, they were instructed to contact us.

## 2023-07-28 ENCOUNTER — Encounter (INDEPENDENT_AMBULATORY_CARE_PROVIDER_SITE_OTHER): Payer: Self-pay | Admitting: Physician Assistant

## 2023-07-28 ENCOUNTER — Other Ambulatory Visit: Payer: Self-pay

## 2023-07-28 ENCOUNTER — Ambulatory Visit: Payer: BC Managed Care – PPO | Attending: Physician Assistant | Admitting: Physician Assistant

## 2023-07-28 VITALS — BP 102/70 | HR 108 | Ht 63.0 in | Wt 188.0 lb

## 2023-07-28 DIAGNOSIS — F419 Anxiety disorder, unspecified: Secondary | ICD-10-CM | POA: Insufficient documentation

## 2023-07-28 DIAGNOSIS — F32A Depression, unspecified: Secondary | ICD-10-CM | POA: Insufficient documentation

## 2023-07-28 DIAGNOSIS — G8929 Other chronic pain: Secondary | ICD-10-CM | POA: Insufficient documentation

## 2023-07-28 DIAGNOSIS — M5126 Other intervertebral disc displacement, lumbar region: Secondary | ICD-10-CM | POA: Insufficient documentation

## 2023-07-28 DIAGNOSIS — M5441 Lumbago with sciatica, right side: Secondary | ICD-10-CM | POA: Insufficient documentation

## 2023-07-28 NOTE — Progress Notes (Signed)
INTERNAL MEDICINE, BUILDING A  [address]  [place] [address]  Operated by [facility]  Progress Note    Name: [name] [name] MRN:  [id number]   Date: 07/28/2023 DOB:  [birth year] (37 y.o.)              Chief Complaint: Follow Up (Back pain)       HPI: [name] [name] is a 37 y.o. female who had complaints of  increased back pain along w stress and anxiety due to her job. She was placed off work due to issues and is here for follow-up as well as plans to return to work tomorrow if given approval.  She did go back to her neurosurgeon/pain management center for repeat injection 2 weeks ago  and once again has a L4-5 and upper L5 level good sized disc protrusion in the midline extending to both lateral recesses.  Patient states the shot has started to work but they always tell her to wait 3 weeks for full effect.  Patient states she really thinks that she can go back to work so requests release for full duty tomorrow.  She was also reporting increased anxiety w stress assoc w her job in his specific child who they had had behavior issues out of.  Patient states child was not reprimanded for his behavior once again so she has decided to just continue making note turning it into her superiors in attempts that something will finally be done because she can not stay off of work in fear of this 1 individual.  She does think the time off of work over the past 2 weeks has helped her mood and feels ready to go back at this time.    Allergies:  Allergies   Allergen Reactions    Cefuroxime  Other Adverse Reaction (Add comment)     Other Reaction(s): GI Intolerance    Vomiting? Unsure to just po med.    Cefuroxime Axetil      Vomiting? Unsure to just po med.    Bupropion Itching and  Other Adverse Reaction (Add comment)     Other Reaction(s): Not available       Current Medications:  ascorbic acid, vitamin C, (VITAMIN C) 500 mg Oral Tablet, Take 1 Tablet (500 mg total) by mouth Once a  day  cholecalciferol, vitamin D3, 25 mcg (1,000 unit) Oral Tablet, Take 1 Tablet (1,000 Units total) by mouth Once a day  cyclobenzaprine (FLEXERIL) 10 mg Oral Tablet, Take 1 Tablet (10 mg total) by mouth Three times a day as needed for Muscle spasms  diazePAM (VALIUM) 2 mg Oral Tablet, Take 1 Tablet (2 mg total) by mouth Every 6 hours as needed (bladder spasms)  Ibuprofen (MOTRIN) 800 mg Oral Tablet, Take 1 Tablet (800 mg total) by mouth Three times a day as needed for Pain  LORazepam (ATIVAN) 0.5 mg Oral Tablet, Take 1 Tablet (0.5 mg total) by mouth Twice per day as needed for Anxiety  magnesium oxide (MAG-OX) 400 mg Oral Tablet, Take 1 Tablet (400 mg total) by mouth Per instructions Tuesday,thursday  naproxen (NAPROSYN) 500 mg Oral Tablet, Take 1 Tablet (500 mg total) by mouth Twice daily with food  ondansetron (ZOFRAN ODT) 4 mg Oral Tablet, Rapid Dissolve, Take 1 Tablet (4 mg total) by mouth Every 8 hours as needed for Nausea/Vomiting  pantoprazole (PROTONIX) 40 mg Oral Tablet, Delayed Release (E.C.), Take 1 Tablet (40 mg total) by mouth Once a day  phenazopyridine (PYRIDIUM) 200  mg Oral Tablet, Take 1 Tablet (200 mg total) by mouth Three times a day as needed for Pain  polysaccharide iron complex (POLY-IRON) 150 mg iron Oral Capsule, Take 1 Capsule (150 mg total) by mouth Twice daily (Patient taking differently: Take 1 Capsule (150 mg total) by mouth Every Monday, Wednesday and Friday)  prochlorperazine (COMPAZINE) 5 mg Oral Tablet, Take 1 Tablet (5 mg total) by mouth Every 6 hours as needed for Nausea/Vomiting  solifenacin (VESICARE) 5 mg Oral Tablet, Take 1 Tablet (5 mg total) by mouth Once a day  tamsulosin (FLOMAX) 0.4 mg Oral Capsule, Take 1 Capsule (0.4 mg total) by mouth Every evening after dinner  zinc sulfate (ZINCATE) 50 mg zinc (220 mg) Oral Capsule, Take 1 Capsule (50 mg total) by mouth Once a day  oxyCODONE-acetaminophen (PERCOCET) 5-325 mg Oral Tablet, Take 1 Tablet by mouth Twice per day as  needed for Pain    No facility-administered medications prior to visit.       Past Medical History:   Diagnosis Date    Anxiety     Depression     Esophageal reflux     Iron deficiency anemia, unspecified     Syncope     Unspecified urinary incontinence     Weight loss due to medication            Social History     Tobacco Use    Smoking status: Never    Smokeless tobacco: Never   Vaping Use    Vaping status: Never Used   Substance Use Topics    Alcohol use: Never    Drug use: Never       OBJECTIVE:  Vitals:    07/28/23 0826   BP: 102/70   Pulse: (!) 108   SpO2: 99%   Weight: 85.3 kg (188 lb)   Height: 1.6 m (5\' 3" )   BMI: 33.3      Physical Exam  Vitals and nursing note reviewed.   Constitutional:       Appearance: Normal appearance.     Cardiovascular:      Rate and Rhythm: Regular rhythm.  Rate     Pulses: Normal pulses.   Pulmonary:      Effort: Pulmonary effort is normal.      Breath sounds: Normal breath sounds.   Musculoskeletal:    +straight leg raise on RT still positive however at around 85  Gait overall stable  Flexion of lower back improved from previous visit however still limited   Pedal pulses = bilat   Skin:     General: Skin is warm.      Findings: No rash.   Neurological:      General: No focal deficit present.      Mental Status: She is alert and oriented to person, place, and time.   Psychiatric:         Mood and Affect: Mood normal.         Behavior: Behavior normal.         Thought Content: Thought content normal.         Judgment: Judgment normal.        Assessment & Plan  Chronic midline low back pain with right-sided sciatica  Patient to continue following with her neurosurgeon/pain management group as scheduled  Will be released to work full duty  HNP (herniated nucleus pulposus), lumbar    Anxiety  Continue Ativan p.r.n.  Depression, unspecified depression type  PLAN: Treatment per orders . Call or return to clinic prn if these symptoms worsen or fail to improve as  anticipated.  No orders of the defined types were placed in this encounter.     Post-Discharge Follow Up Appointments       Tuesday Jul 29, 2023    Return Patient Visit with Laretta Alstrom, PA-C at  2:00 PM      Thursday Sep 04, 2023    Return Patient Visit with Eyvonne Mechanic, PA-C at  3:15 PM      Monday Mar 08, 2024    Return Patient Visit with Eyvonne Mechanic, PA-C at  3:00 PM      Internal Medicine, Building A  Building [facility]  [address]  [place] [address]  [phone] Urology, [facility] [place], [place]  [address]  [place] [address]  [phone]             This note was partially created using MModal Fluency Direct system (voice recognition software) and is inherently subject to errors including those of syntax and "sound-alike" substitutions which may escape proofreading.  In such instances, original meaning may be extrapolated by contextual derivation.    Jahsir Rama, PA-C

## 2023-07-28 NOTE — Assessment & Plan Note (Signed)
Continue Ativan prn

## 2023-07-28 NOTE — Nursing Note (Signed)
Patient is in the office today for a follow up on her back pain.

## 2023-07-28 NOTE — Assessment & Plan Note (Signed)
Patient to continue following with her neurosurgeon/pain management group as scheduled  Will be released to work full duty

## 2023-07-29 ENCOUNTER — Ambulatory Visit (INDEPENDENT_AMBULATORY_CARE_PROVIDER_SITE_OTHER): Payer: BC Managed Care – PPO | Admitting: Physician Assistant

## 2023-07-29 ENCOUNTER — Encounter (INDEPENDENT_AMBULATORY_CARE_PROVIDER_SITE_OTHER): Payer: Self-pay | Admitting: Physician Assistant

## 2023-07-29 VITALS — BP 83/62 | HR 112 | Ht 63.0 in | Wt 187.6 lb

## 2023-07-29 DIAGNOSIS — Z9889 Other specified postprocedural states: Secondary | ICD-10-CM

## 2023-07-29 DIAGNOSIS — N200 Calculus of kidney: Secondary | ICD-10-CM

## 2023-07-29 NOTE — Progress Notes (Signed)
UROLOGY, [facility]  [address] [place] [address]    Progress Note    Name: [name] [name] MRN:  [id number]   Date: 07/29/2023 DOB:  [birth year] (37 y.o.)             Chief Complaint: Post Op (flexible cystoscopy, right ureteral stent removal 06/30/2023)  Subjective   Subjective:   [name] [name] is a pleasant 37 y.o. White female whom presents to the clinic for right hydronephrosis secondary to right 4-71mm ureterolithiasis sp right stone manipulation with laser lithotripsy and basket extraction with stent placement 06/25/2023 and stent removal 06/30/2023. Stone analysis  Calcium Oxalate Monohydrate (Whewellite) 95%,Carbonate Apatite (Dahllite) 5%  . Patient reports pain completely resolved. Patient denies fevers, chills, nausea, vomiting, hematuria, dysuria, flank pain, incontinence, dribbling, hesitancy, suprapubic pain, headaches, vision changes, shortness of breath, chest pain.   Patient denies personal or family history of urinary malignancy. Patient denies occupational chemical exposure. Patient denies history of tobacco use.       Objective   Objective :  BP (!) 83/62 (Site: Right Arm, Patient Position: Sitting, Cuff Size: Adult)   Pulse (!) 112   Ht 1.6 m (5\' 3" )   Wt 85.1 kg (187 lb 9.6 oz)   LMP 06/08/2023 (Approximate)   BMI 33.23 kg/m     Gen: NAD, alert  Pulm: unlabored at rest  CV: palpable pulses  Abd: soft, Nt/ND  GU: no suprapubic tenderness, no CVAT    Data reviewed:    Current Outpatient Medications   Medication Sig    ascorbic acid, vitamin C, (VITAMIN C) 500 mg Oral Tablet Take 1 Tablet (500 mg total) by mouth Once a day    cholecalciferol, vitamin D3, 25 mcg (1,000 unit) Oral Tablet Take 1 Tablet (1,000 Units total) by mouth Once a day    cyclobenzaprine (FLEXERIL) 10 mg Oral Tablet Take 1 Tablet (10 mg total) by mouth Three times a day as needed for Muscle spasms    diazePAM (VALIUM) 2 mg Oral Tablet Take 1 Tablet (2 mg total) by mouth Every 6 hours as needed  (bladder spasms)    Ibuprofen (MOTRIN) 800 mg Oral Tablet Take 1 Tablet (800 mg total) by mouth Three times a day as needed for Pain    LORazepam (ATIVAN) 0.5 mg Oral Tablet Take 1 Tablet (0.5 mg total) by mouth Twice per day as needed for Anxiety    magnesium oxide (MAG-OX) 400 mg Oral Tablet Take 1 Tablet (400 mg total) by mouth Per instructions Tuesday,thursday    naproxen (NAPROSYN) 500 mg Oral Tablet Take 1 Tablet (500 mg total) by mouth Twice daily with food    ondansetron (ZOFRAN ODT) 4 mg Oral Tablet, Rapid Dissolve Take 1 Tablet (4 mg total) by mouth Every 8 hours as needed for Nausea/Vomiting    pantoprazole (PROTONIX) 40 mg Oral Tablet, Delayed Release (E.C.) Take 1 Tablet (40 mg total) by mouth Once a day    phenazopyridine (PYRIDIUM) 200 mg Oral Tablet Take 1 Tablet (200 mg total) by mouth Three times a day as needed for Pain    polysaccharide iron complex (POLY-IRON) 150 mg iron Oral Capsule Take 1 Capsule (150 mg total) by mouth Twice daily (Patient taking differently: Take 1 Capsule (150 mg total) by mouth Every Monday, Wednesday and Friday)    prochlorperazine (COMPAZINE) 5 mg Oral Tablet Take 1 Tablet (5 mg total) by mouth Every 6 hours as needed for Nausea/Vomiting    solifenacin (VESICARE)  5 mg Oral Tablet Take 1 Tablet (5 mg total) by mouth Once a day    tamsulosin (FLOMAX) 0.4 mg Oral Capsule Take 1 Capsule (0.4 mg total) by mouth Every evening after dinner    zinc sulfate (ZINCATE) 50 mg zinc (220 mg) Oral Capsule Take 1 Capsule (50 mg total) by mouth Once a day        Assessment/Plan  Problem List Items Addressed This Visit    None    Right Hydronephrosis secondary to right 4-49mm ureterolithiasis sp right stone manipulation with laser lithotripsy and basket extraction with stent placement 06/25/2023 and stent removal 06/30/2023.   Resolved    Nephrolithiasis  Calcium Oxalate Monohydrate (Whewellite) 95%  Carbonate Apatite (Dahllite) 5%   I discussed the differential diagnosis, pathophysiology  and nature of urolithiasis and recurrent stone disease.  We also reviewed the recent 2014 American Urological Association guideline on "Medical Management of Kidney Stones" and specifically discussed dietary therapies including:  Increase fluid intake to achieve urine output of at least 2.5 liters daily  Limit sodium intake to no more than 100 mEq (2,300 mg) per day  Consume 1,000-1,200 mg of dietary calcium per day  Limit oxalate-rich foods (beets, spinach, rhubarb, nuts, chocolate)  Limit non-dairy animal protein  Encouraged increased fruit and vegetable intake   [facility] 24h urinalysis with follow up after      Diara Chaudhari, PA-C

## 2023-08-12 NOTE — Telephone Encounter (Signed)
Short term disability papers filled out and faxed

## 2023-09-04 ENCOUNTER — Other Ambulatory Visit: Payer: Self-pay

## 2023-09-04 ENCOUNTER — Ambulatory Visit: Payer: BC Managed Care – PPO | Attending: Physician Assistant | Admitting: Physician Assistant

## 2023-09-04 ENCOUNTER — Encounter (INDEPENDENT_AMBULATORY_CARE_PROVIDER_SITE_OTHER): Payer: Self-pay | Admitting: Physician Assistant

## 2023-09-04 DIAGNOSIS — M5441 Lumbago with sciatica, right side: Secondary | ICD-10-CM

## 2023-09-04 DIAGNOSIS — F419 Anxiety disorder, unspecified: Secondary | ICD-10-CM

## 2023-09-04 DIAGNOSIS — M5126 Other intervertebral disc displacement, lumbar region: Secondary | ICD-10-CM

## 2023-09-04 DIAGNOSIS — K219 Gastro-esophageal reflux disease without esophagitis: Secondary | ICD-10-CM

## 2023-09-04 DIAGNOSIS — F32A Depression, unspecified: Secondary | ICD-10-CM

## 2023-09-04 DIAGNOSIS — G8929 Other chronic pain: Secondary | ICD-10-CM

## 2023-09-04 MED ORDER — IBUPROFEN 800 MG TABLET
800.0000 mg | ORAL_TABLET | Freq: Three times a day (TID) | ORAL | 1 refills | Status: DC | PRN
Start: 2023-09-04 — End: 2024-04-19

## 2023-09-04 MED ORDER — LORAZEPAM 0.5 MG TABLET
0.5000 mg | ORAL_TABLET | Freq: Two times a day (BID) | ORAL | 2 refills | Status: DC | PRN
Start: 2023-09-04 — End: 2024-04-01

## 2023-09-04 MED ORDER — PANTOPRAZOLE 40 MG TABLET,DELAYED RELEASE
40.0000 mg | DELAYED_RELEASE_TABLET | Freq: Every day | ORAL | 1 refills | Status: DC
Start: 2023-09-04 — End: 2024-03-11

## 2023-09-04 MED ORDER — SOLIFENACIN 5 MG TABLET
5.0000 mg | ORAL_TABLET | Freq: Every day | ORAL | 1 refills | Status: DC
Start: 2023-09-04 — End: 2024-04-19

## 2023-09-04 MED ORDER — CYCLOBENZAPRINE 10 MG TABLET
10.0000 mg | ORAL_TABLET | Freq: Three times a day (TID) | ORAL | 1 refills | Status: DC | PRN
Start: 2023-09-04 — End: 2024-04-19

## 2023-09-04 MED ORDER — POLYSACCHARIDE IRON COMPLEX 150 MG IRON CAPSULE
150.0000 mg | ORAL_CAPSULE | ORAL | 3 refills | Status: DC
Start: 2023-09-05 — End: 2024-06-14

## 2023-09-04 NOTE — Assessment & Plan Note (Signed)
Continue Ativan 0.5 mg b.i.d. p.r.n.-refills given

## 2023-09-04 NOTE — Assessment & Plan Note (Signed)
Continue diet along with Protonix

## 2023-09-04 NOTE — Assessment & Plan Note (Signed)
Continue following with neurosurgeon  Refills on Motrin and Flexeril given

## 2023-09-04 NOTE — Progress Notes (Signed)
INTERNAL MEDICINE, [facility]  [address]  [place] [address]        Telephone Visit    Name:  [name] [name] MRN: [id number]   Date:  09/04/2023 Age:   38 y.o.     The patient/family initiated a request for telephone service.  Verbal consent for this service was obtained from the patient/family.    TELEMEDICINE DOCUMENTATION:    [facility]    Patient/family aware of provider location:  yes  Patient/family consent for telemedicine:  yes  Examination observed and performed by:  Eyvonne Mechanic, PA-C       Chief Complaint   Patient presents with    Medication Refill    Follow Up 3 Months        Call notes:  [name] [name] is a 38 y.o. female who is being seen today via telemed due to bad weather in f/u concerning low back pain s/p injection which she states has now worn out. Recently seen by neurosurgeon before christmas break at which time she was told that was the life of the shot and next step would be surgery. Pt states she has good days and bad days. Trying to hold out til summer if she needs surgery.    Follow-up gastroesophageal reflux disease currently stable on Protonix 40 mg daily.  Patient denies any abdominal pain nausea vomiting diarrhea.    Follow-up anxiety/depression for which patient is currently taking Ativan 0.5 twice daily.  Patient is doing well on Ativan which seems to help calm her and specifically in the evenings helps her rest.  she denies any panic attacks new issues or concerns.  Requests medication refill.    Patient follows with GYN who  placed her on Vesicare 5 mg daily for urinary incontinence  She also continues to follow with cardiologist due to her previous issues with syncope.  Noted that she has been on Z bound in efforts to lose weight as well as recommended by her cardiologist.    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 Syncope and collapse R55 EKG (12-LEAD)   TELEMETRY (E.G.ACT) MONITOR HOOKUP AND CONTINUOUS FEED   AMB REFERRAL TO CARDIOLOGY       38 y.o. female who presents today for evaluation of recurrent syncope, which does seem to appear at an alarming rate, usually at least once per week. Symptoms do sound most consistent with orthostatic hypotension or other form of similar dysautonomic syndrome. In light of how frequent these are occurring, it might be reasonable to get her into see cardiology at [facility] with their dysautonomia syndrome clinic. In the meantime however I will get a telemetry monitor to exclude an arrhythmogenic etiology. Her echocardiogram performed at an outside facility did not demonstrate any obvious structural abnormalities.    PLAN:   1. Will get a telemetry monitor  2. If this is unremarkable we will consider the addition of either midodrine or pyridostigmine. She did not tolerate Florinef due to hypertension  3. We did extensive discussion about lifestyle modification, which is one of the mainstays of treatment and the sorts of situations. I advised that she liberalize her salt and fluid intake and wear compression stockings. I also advised that she change positions slowly and wear compression stockings. I also advised that exercise can be helpful  in these situations.  4. Referral generated to the dysautonomia clinic at [facility]    Disposition:   Return to clinic in: 2 months    Levonne Spiller, DO  [facility] Cardiology  Assistant Professor, [employer]     Current Medications:     ascorbic acid, vitamin C, (VITAMIN C) 500 mg Oral Tablet Take 1 Tablet (500 mg total) by mouth Once a day    cholecalciferol, vitamin D3, 25 mcg (1,000 unit) Oral Tablet Take 1 Tablet (1,000 Units total) by mouth Once a day    cyclobenzaprine (FLEXERIL) 10 mg Oral Tablet Take 1 Tablet (10 mg total) by mouth Three times a day as needed for Muscle spasms    Ibuprofen (MOTRIN)  800 mg Oral Tablet Take 1 Tablet (800 mg total) by mouth Three times a day as needed for Pain    LORazepam (ATIVAN) 0.5 mg Oral Tablet Take 1 Tablet (0.5 mg total) by mouth Twice per day as needed for Anxiety    magnesium oxide (MAG-OX) 400 mg Oral Tablet Take 1 Tablet (400 mg total) by mouth Per instructions Tuesday,thursday    pantoprazole (PROTONIX) 40 mg Oral Tablet, Delayed Release (E.C.) Take 1 Tablet (40 mg total) by mouth Once a day    [START ON 09/05/2023] polysaccharide iron complex (POLY-IRON) 150 mg iron Oral Capsule Take 1 Capsule (150 mg total) by mouth Every Monday, Wednesday and Friday    solifenacin (VESICARE) 5 mg Oral Tablet Take 1 Tablet (5 mg total) by mouth Once a day    zinc sulfate (ZINCATE) 50 mg zinc (220 mg) Oral Capsule Take 1 Capsule (50 mg total) by mouth Once a day        Allergies   Allergen Reactions    Cefuroxime  Other Adverse Reaction (Add comment)     Other Reaction(s): GI Intolerance    Vomiting? Unsure to just po med.    Cefuroxime Axetil      Vomiting? Unsure to just po med.    Bupropion Itching and  Other Adverse Reaction (Add comment)     Other Reaction(s): Not available        Past Medical History:   Diagnosis Date    Anxiety     Depression     Esophageal reflux     Iron deficiency anemia, unspecified     Syncope     Unspecified urinary incontinence     Weight loss due to medication         Social History     Socioeconomic History    Marital status: Married   Tobacco Use    Smoking status: Never    Smokeless tobacco: Never   Vaping Use    Vaping status: Never Used   Substance and Sexual Activity    Alcohol use: Never    Drug use: Never     Social Determinants of Health     Transportation Needs: No Transportation Needs (02/03/2023)    [facility]    Transportation     In the past 12 months, has lack of reliable transportation kept you from medical appointments, meetings, work or from getting things needed for daily living? : No   Housing  Stability: Low Risk  (02/03/2023)    [facility]    Housing Stability Vital Sign     What is your living situation today?: I have a steady place to live     Think  about the place you live. Do you have problems with any of the following? Choose all that apply:: None/None on this list        Past Surgical History:   Procedure Laterality Date    CYSTOSCOPY; LASER LITHOTRIPSY; RIGHT URETEROSCOPY; RIGHT RETROGRADE PYELOGRAM; RIGHT STENT PLACEMENT Right 06/25/2023    Performed by Arlana Hove, DO at [facility]    FLEXIBLE CYSTOSCOPY WITH RIGHT URETERAL STENT REMOVAL Right 06/30/2023    Performed by Arlana Hove, DO at [facility]    HX APPENDECTOMY      HX CHOLECYSTECTOMY      HX SALPINGECTOMY          Family Medical History:       Problem Relation (Age of Onset)    Arthritis-osteo Father    Coronary Artery Disease Mother    Diabetes type II Maternal Grandmother    Hypothyroidism Sister    Kidney Stones Maternal Grandmother               Physical Exam:    Physical Exam  Constitutional:       General: She is not in acute distress.  Pulmonary:      Effort: Pulmonary effort is normal.      Comments: Pt able to speak in complete sentences    Neurological:      Mental Status: She is alert.   Psychiatric:         Mood and Affect: Mood normal.         Behavior: Behavior normal.         Thought Content: Thought content normal.         Judgment: Judgment normal.       Assessment & Plan  Chronic midline low back pain with right-sided sciatica  Continue following with neurosurgeon  Refills on Motrin and Flexeril given  HNP (herniated nucleus pulposus), lumbar    Gastroesophageal reflux disease, unspecified whether esophagitis present  Continue diet along with Protonix  Anxiety  Continue Ativan 0.5 mg b.i.d. p.r.n.-refills given  Depression, unspecified depression type       Orders Placed This Encounter    LORazepam (ATIVAN) 0.5 mg Oral Tablet    polysaccharide iron complex (POLY-IRON) 150 mg iron  Oral Capsule    pantoprazole (PROTONIX) 40 mg Oral Tablet, Delayed Release (E.C.)    Ibuprofen (MOTRIN) 800 mg Oral Tablet    cyclobenzaprine (FLEXERIL) 10 mg Oral Tablet    solifenacin (VESICARE) 5 mg Oral Tablet        Total provider time spent with the patient on the phone: 18 minutes.    Post-Discharge Follow Up Appointments       Wednesday Dec 03, 2023    Return Patient Visit with Eyvonne Mechanic, PA-C at  3:00 PM      Monday Mar 08, 2024    Return Patient Visit with Eyvonne Mechanic, PA-C at  3:00 PM      Internal Medicine, Building A  [address] [place], [place]  [address]  [place] [address]  [phone]             This note was partially created using MModal Fluency Direct system (voice recognition software) and is inherently subject to errors including those of syntax and "sound-alike" substitutions which may escape proofreading.  In such instances, original meaning may be extrapolated by contextual derivation.    Kendrell Lottman, PA-C

## 2023-10-22 ENCOUNTER — Ambulatory Visit (INDEPENDENT_AMBULATORY_CARE_PROVIDER_SITE_OTHER): Payer: BC Managed Care – PPO | Admitting: Physician Assistant

## 2023-10-22 ENCOUNTER — Other Ambulatory Visit: Payer: Self-pay

## 2023-10-22 VITALS — BP 122/78 | HR 85 | Ht 63.0 in | Wt 207.0 lb

## 2023-10-22 DIAGNOSIS — N200 Calculus of kidney: Secondary | ICD-10-CM

## 2023-10-22 NOTE — Progress Notes (Signed)
 UROLOGY, [facility]  [address] [place] [address]    Progress Note    Name: [name] [name] MRN:  [id number]   Date: 10/22/2023 DOB:  03-15-1986 (37 y.o.)             Chief Complaint: Follow Up (Litholink results)  Subjective   Subjective:   [name] [name] is a pleasant 38 y.o. White female whom presents to the clinic for right hydronephrosis secondary to right 4-68mm ureterolithiasis sp right stone manipulation with laser lithotripsy and basket extraction with stent placement 06/25/2023 and stent removal 06/30/2023. Stone analysis  Calcium Oxalate Monohydrate (Whewellite) 95%,Carbonate Apatite (Dahllite) 5%  . Patient reports pain completely resolved. Patient denies fevers, chills, nausea, vomiting, hematuria, dysuria, flank pain, incontinence, dribbling, hesitancy, suprapubic pain, headaches, vision changes, shortness of breath, chest pain. Patient denies personal or family history of urinary malignancy. Patient denies occupational chemical exposure. Patient denies history of tobacco use.    Today, patient presents for follow up of Litholink 09/09/23: U volume 2.17L, Ca Ox SS 5.74, Urine Ca 201. Patient denies fevers, chills, nausea, vomiting, hematuria, dysuria, flank pain, incontinence, dribbling, hesitancy, suprapubic pain, headaches, vision changes, shortness of breath, chest pain. Patient denies personal or family history of urinary malignancy. Patient denies occupational chemical exposure. Patient denies history of tobacco use.       Objective   Objective :  BP 122/78 (Site: Right Arm, Patient Position: Sitting, Cuff Size: Adult)   Pulse 85   Ht 1.6 m (5\' 3" )   Wt 93.9 kg (207 lb)   BMI 36.67 kg/m     Gen: NAD, alert  Pulm: unlabored at rest  CV: palpable pulses  Abd: soft, Nt/ND  GU: no suprapubic tenderness, no CVAT    Data reviewed:    Current Outpatient Medications   Medication Sig    ascorbic acid, vitamin C, (VITAMIN C) 500 mg Oral Tablet Take 1 Tablet (500 mg total) by  mouth Once a day    cholecalciferol, vitamin D3, 25 mcg (1,000 unit) Oral Tablet Take 1 Tablet (1,000 Units total) by mouth Once a day    cyclobenzaprine (FLEXERIL) 10 mg Oral Tablet Take 1 Tablet (10 mg total) by mouth Three times a day as needed for Muscle spasms    Ibuprofen (MOTRIN) 800 mg Oral Tablet Take 1 Tablet (800 mg total) by mouth Three times a day as needed for Pain    LORazepam (ATIVAN) 0.5 mg Oral Tablet Take 1 Tablet (0.5 mg total) by mouth Twice per day as needed for Anxiety    magnesium oxide (MAG-OX) 400 mg Oral Tablet Take 1 Tablet (400 mg total) by mouth Per instructions Tuesday,thursday    pantoprazole (PROTONIX) 40 mg Oral Tablet, Delayed Release (E.C.) Take 1 Tablet (40 mg total) by mouth Once a day    polysaccharide iron complex (POLY-IRON) 150 mg iron Oral Capsule Take 1 Capsule (150 mg total) by mouth Every Monday, Wednesday and Friday    solifenacin (VESICARE) 5 mg Oral Tablet Take 1 Tablet (5 mg total) by mouth Once a day    zinc sulfate (ZINCATE) 50 mg zinc (220 mg) Oral Capsule Take 1 Capsule (50 mg total) by mouth Once a day        Assessment/Plan  Problem List Items Addressed This Visit    None    Right Hydronephrosis secondary to right 4-27mm ureterolithiasis sp right stone manipulation with laser lithotripsy and basket extraction with stent placement 06/25/2023 and stent removal  06/30/2023.   Resolved    Nephrolithiasis  Calcium Oxalate Monohydrate (Whewellite) 95%  Carbonate Apatite (Dahllite) 5%   Litholink 09/09/23: Ca Ox SS 5.74, Urine Ca 201  I discussed the differential diagnosis, pathophysiology and nature of urolithiasis and recurrent stone disease.  We also reviewed the recent 2014 American Urological Association guideline on "Medical Management of Kidney Stones" and specifically discussed dietary therapies including:  Increase fluid intake to achieve urine output of at least 2.5 liters daily  Limit sodium intake to no more than 100 mEq (2,300 mg) per day  Consume  1,000-1,200 mg of dietary calcium per day  Limit oxalate-rich foods (beets, spinach, rhubarb, nuts, chocolate)  Limit non-dairy animal protein  Encouraged increased fruit and vegetable intake   Litholink 24h urinalysis 6 months  KUB in 1 year      Sandra Tellefsen, PA-C

## 2023-10-23 ENCOUNTER — Encounter (INDEPENDENT_AMBULATORY_CARE_PROVIDER_SITE_OTHER): Payer: Self-pay | Admitting: Physician Assistant

## 2023-10-23 ENCOUNTER — Ambulatory Visit: Payer: BC Managed Care – PPO | Attending: Physician Assistant | Admitting: Physician Assistant

## 2023-10-23 VITALS — BP 102/74 | HR 84 | Ht 63.0 in | Wt 207.0 lb

## 2023-10-23 DIAGNOSIS — J020 Streptococcal pharyngitis: Secondary | ICD-10-CM | POA: Insufficient documentation

## 2023-10-23 DIAGNOSIS — U071 COVID-19: Secondary | ICD-10-CM | POA: Insufficient documentation

## 2023-10-23 DIAGNOSIS — R590 Localized enlarged lymph nodes: Secondary | ICD-10-CM | POA: Insufficient documentation

## 2023-10-23 DIAGNOSIS — R52 Pain, unspecified: Secondary | ICD-10-CM | POA: Insufficient documentation

## 2023-10-23 DIAGNOSIS — R0981 Nasal congestion: Secondary | ICD-10-CM | POA: Insufficient documentation

## 2023-10-23 MED ORDER — CLARITHROMYCIN 500 MG TABLET
500.0000 mg | ORAL_TABLET | Freq: Two times a day (BID) | ORAL | 0 refills | Status: DC
Start: 2023-10-23 — End: 2023-11-13

## 2023-10-23 MED ORDER — TRIAMCINOLONE ACETONIDE 40 MG/ML SUSPENSION FOR INJECTION
40.0000 mg | INTRAMUSCULAR | Status: AC
Start: 2023-10-23 — End: 2023-10-23
  Administered 2023-10-23: 40 mg via INTRAMUSCULAR

## 2023-10-23 NOTE — Nursing Note (Signed)
 Patient is in the office with complaints of a sore throat for the past 2 days.

## 2023-10-23 NOTE — Progress Notes (Signed)
 INTERNAL MEDICINE, BUILDING [facility]  [address]  [place] [address]  Operated by [facility]  Progress Note    Name: [name] [name] MRN:  [id number]   Date: 10/23/2023 DOB:  07/29/1986 (38 y.o.)              Chief Complaint: Sore Throat (X 2 days )       HPI: [name] [name] is a 38 y.o. female who complains of  sore throat with glands tender to palp which started 2 days ago. +sinus cong Burnard Bunting cong noted as well. States was seen by [facility] on 2/7 and told had strep so was placed on Augmentin. States she felt like she did get better then sx came back again. 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 +mild body aches w nausea noted. Denies cough.    Allergies:  Allergies   Allergen Reactions    Cefuroxime  Other Adverse Reaction (Add comment)     Other Reaction(s): GI Intolerance    Vomiting? Unsure to just po med.    Cefuroxime Axetil      Vomiting? Unsure to just po med.    Bupropion Itching and  Other Adverse Reaction (Add comment)     Other Reaction(s): Not available       Current Medications:  ascorbic acid, vitamin C, (VITAMIN C) 500 mg Oral Tablet, Take 1 Tablet (500 mg total) by mouth Once a day  cholecalciferol, vitamin D3, 25 mcg (1,000 unit) Oral Tablet, Take 1 Tablet (1,000 Units total) by mouth Once a day  cyclobenzaprine (FLEXERIL) 10 mg Oral Tablet, Take 1 Tablet (10 mg total) by mouth Three times a day as needed for Muscle spasms  Ibuprofen (MOTRIN) 800 mg Oral Tablet, Take 1 Tablet (800 mg total) by mouth Three times a day as needed for Pain  LORazepam (ATIVAN) 0.5 mg Oral Tablet, Take 1 Tablet (0.5 mg total) by mouth Twice per day as needed for Anxiety  magnesium oxide (MAG-OX) 400 mg Oral Tablet, Take 1 Tablet (400 mg total) by mouth Per instructions Tuesday,thursday  pantoprazole (PROTONIX) 40 mg Oral Tablet, Delayed Release (E.C.), Take 1 Tablet (40 mg total) by mouth Once a day  polysaccharide iron complex (POLY-IRON) 150 mg iron Oral  Capsule, Take 1 Capsule (150 mg total) by mouth Every Monday, Wednesday and Friday  solifenacin (VESICARE) 5 mg Oral Tablet, Take 1 Tablet (5 mg total) by mouth Once a day  zinc sulfate (ZINCATE) 50 mg zinc (220 mg) Oral Capsule, Take 1 Capsule (50 mg total) by mouth Once a day    No facility-administered medications prior to visit.       Past Medical History:   Diagnosis Date    Anxiety     Depression     Esophageal reflux     Iron deficiency anemia, unspecified     Syncope     Unspecified urinary incontinence     Weight loss due to medication            Social History     Tobacco Use    Smoking status: Never    Smokeless tobacco: Never   Vaping Use    Vaping status: Never Used   Substance Use Topics    Alcohol use: Never    Drug use: Never       OBJECTIVE:  Vitals:    10/23/23 1520   BP: 102/74   Pulse: 84   SpO2: 98%  Weight: 93.9 kg (207 lb)   Height: 1.6 m (5\' 3" )   BMI: 36.67      Physical Exam  Vitals and nursing note reviewed.   Constitutional:       Appearance: Normal appearance.   HENT:      Ears:      Comments: TM's dull bilat  No sinus cavity tenderness on palpation     Mouth/Throat:      Mouth: Mucous membranes are moist.      Pharynx: Posterior oropharyngeal erythema (Large cryptic tonsils bilateral right greater than left) present.   Eyes:      Pupils: Pupils are equal, round, and reactive to light.   Cardiovascular:      Rate and Rhythm: Normal rate and regular rhythm.      Pulses: Normal pulses.   Pulmonary:      Effort: Pulmonary effort is normal.      Breath sounds: Normal breath sounds.   Abdominal:      Palpations: Abdomen is soft.      Tenderness: There is no abdominal tenderness.   Musculoskeletal:      Cervical back: Normal range of motion and neck supple.   Lymphadenopathy:      Cervical: Cervical adenopathy (Enlarged tender bilateral tonsillar lymph nodes right-greater-than-left) present.   Skin:     General: Skin is warm.      Findings: No rash.   Neurological:      Mental Status: She is  alert and oriented to person, place, and time.   Psychiatric:         Mood and Affect: Mood normal.         Behavior: Behavior normal.         Thought Content: Thought content normal.         Judgment: Judgment normal.          Rapid Strep Results:   Rapid Strep: Positive     RAPID FLU A/B      Negative     RAPID COVID:  POSITIVE  Assessment & Plan  COVID  Antiviral refused antiviral refused  To increase vitamin-C zinc  Strep pharyngitis  Biaxin 500 mg b.i.d. times 10 days  Work excuse given  Enlarged lymph node in neck  Kenalog injection given  Sinus congestion  May use Flonase as directed p.r.n.  Body aches  Rest fluids  Tylenol/Motrin p.r.n.       PLAN: Treatment per orders . Call or return to clinic prn if these symptoms worsen or fail to improve as anticipated.  Orders Placed This Encounter    THROAT CULTURE, BETA HEMOLYTIC STREPTOCOCCUS    triamcinolone acetonide (KENALOG-40) 40 mg/mL injection    clarithromycin (BIAXIN) 500 mg Oral Tablet      Post-Discharge Follow Up Appointments       Wednesday Dec 03, 2023    Return Patient Visit with Eyvonne Mechanic, PA-C at  3:00 PM      Monday Mar 08, 2024    Return Patient Visit with Eyvonne Mechanic, PA-C at  3:00 PM      Tuesday Apr 20, 2024    Return Patient Visit with Laretta Alstrom, PA-C at  3:30 PM      Internal Medicine, Building A  [facility]  [address]  [place] [address]  [phone] Urology, [facility] [place], [place]  [address]  [place] [address]  [phone]  This note was partially created using MModal Fluency Direct system (voice recognition software) and is inherently subject to errors including those of syntax and "sound-alike" substitutions which may escape proofreading.  In such instances, original meaning may be extrapolated by contextual derivation.    Abrahm Mancia, PA-C

## 2023-10-23 NOTE — Nursing Note (Signed)
 10/23/23 1500   Rapid Flu   Time Performed 1535   Rapid Flu A Result Negative   Rapid Flu B Result Negative   POCT Instrument Sofia   Initials KD,CMA   Rapid Strep   Time Performed 1536   Rapid Strep Positive   Ordering Physician Amy [facility]   POCT Instrument [facility]   Nurse initials [facility]

## 2023-10-23 NOTE — Nursing Note (Signed)
 10/23/23 1500   Rapid Flu   Time Performed 1535   Rapid Flu A Result Negative   Rapid Flu B Result Negative   POCT Instrument Sofia   Initials KD,CMA   Rapid Strep   Time Performed 1536   Rapid Strep Negative   Ordering Physician Amy [facility]   POCT Instrument [facility]   Nurse initials KD,CMA

## 2023-10-26 LAB — THROAT CULTURE, BETA HEMOLYTIC STREPTOCOCCUS: THROAT CULTURE: NORMAL

## 2023-10-27 ENCOUNTER — Ambulatory Visit (INDEPENDENT_AMBULATORY_CARE_PROVIDER_SITE_OTHER): Payer: Self-pay

## 2023-11-05 ENCOUNTER — Telehealth (INDEPENDENT_AMBULATORY_CARE_PROVIDER_SITE_OTHER): Payer: Self-pay | Admitting: Physician Assistant

## 2023-11-05 MED ORDER — PENICILLIN V POTASSIUM 500 MG TABLET
500.0000 mg | ORAL_TABLET | Freq: Four times a day (QID) | ORAL | 0 refills | Status: DC
Start: 2023-11-05 — End: 2023-12-10

## 2023-11-05 NOTE — Nursing Note (Signed)
Patient notified and order sent

## 2023-11-13 ENCOUNTER — Encounter (INDEPENDENT_AMBULATORY_CARE_PROVIDER_SITE_OTHER): Payer: Self-pay | Admitting: Physician Assistant

## 2023-11-13 ENCOUNTER — Ambulatory Visit: Attending: Physician Assistant | Admitting: Physician Assistant

## 2023-11-13 ENCOUNTER — Other Ambulatory Visit: Payer: Self-pay

## 2023-11-13 VITALS — BP 112/72 | HR 77 | Ht 63.0 in | Wt 210.0 lb

## 2023-11-13 DIAGNOSIS — N644 Mastodynia: Secondary | ICD-10-CM | POA: Insufficient documentation

## 2023-11-13 DIAGNOSIS — S2002XA Contusion of left breast, initial encounter: Secondary | ICD-10-CM | POA: Insufficient documentation

## 2023-11-13 LAB — CBC WITH DIFF
BASOPHIL #: 0 10*3/uL (ref 0.00–0.10)
BASOPHIL %: 1 % (ref 0–1)
EOSINOPHIL #: 0.1 10*3/uL (ref 0.00–0.50)
EOSINOPHIL %: 1 % (ref 1–7)
HCT: 42 % — ABNORMAL HIGH (ref 31.2–41.9)
HGB: 14 g/dL (ref 10.9–14.3)
LYMPHOCYTE #: 1.6 10*3/uL (ref 1.10–3.10)
LYMPHOCYTE %: 36 % (ref 16–46)
MCH: 29.6 pg (ref 24.7–32.8)
MCHC: 33.4 g/dL (ref 32.3–35.6)
MCV: 88.8 fL (ref 75.5–95.3)
MONOCYTE #: 0.3 10*3/uL (ref 0.20–0.90)
MONOCYTE %: 7 % (ref 4–11)
MPV: 7.8 fL — ABNORMAL LOW (ref 7.9–10.8)
NEUTROPHIL #: 2.4 10*3/uL (ref 1.90–8.20)
NEUTROPHIL %: 55 % (ref 43–77)
PLATELETS: 236 10*3/uL (ref 140–440)
RBC: 4.73 10*6/uL (ref 3.63–4.92)
RDW: 12.8 % (ref 12.3–17.7)
WBC: 4.4 10*3/uL (ref 3.8–11.8)

## 2023-11-13 LAB — COMPREHENSIVE METABOLIC PNL, FASTING
ALBUMIN/GLOBULIN RATIO: 1.4 (ref 0.8–1.4)
ALBUMIN: 4 g/dL (ref 3.5–5.7)
ALKALINE PHOSPHATASE: 74 U/L (ref 34–104)
ALT (SGPT): 17 U/L (ref 7–52)
AST (SGOT): 21 U/L (ref 13–39)
BILIRUBIN TOTAL: 0.4 mg/dL (ref 0.3–1.0)
BUN/CREA RATIO: 15 (ref 6–22)
BUN: 13 mg/dL (ref 7–25)
CALCIUM, CORRECTED: 9.2 mg/dL (ref 8.9–10.8)
CALCIUM: 9.2 mg/dL (ref 8.6–10.3)
CHLORIDE: 107 mmol/L (ref 98–107)
CO2 TOTAL: 30 mmol/L (ref 21–31)
CREATININE: 0.86 mg/dL (ref 0.60–1.30)
ESTIMATED GFR: 90 mL/min/{1.73_m2} (ref >59)
GLOBULIN: 2.8 (ref 2.0–3.5)
GLUCOSE: 88 mg/dL (ref 74–109)
OSMOLALITY, CALCULATED: 270 mosm/kg (ref 270–290)
POTASSIUM: 3.7 mmol/L (ref 3.5–5.1)
PROTEIN TOTAL: 6.8 g/dL (ref 6.4–8.9)
SODIUM: 135 mmol/L — ABNORMAL LOW (ref 136–145)

## 2023-11-13 MED ORDER — ZEPBOUND 2.5 MG/0.5 ML SUBCUTANEOUS PEN INJECTOR
2.5000 mg | PEN_INJECTOR | SUBCUTANEOUS | 2 refills | Status: DC
Start: 2023-11-13 — End: 2024-01-05

## 2023-11-13 MED ORDER — TRIAMCINOLONE ACETONIDE 0.1 % TOPICAL CREAM
TOPICAL_CREAM | Freq: Two times a day (BID) | CUTANEOUS | 1 refills | Status: AC
Start: 2023-11-13 — End: 2023-11-20

## 2023-11-13 NOTE — Nursing Note (Signed)
 Patient states that she woke up yesterday with a bruise on her left breast. Patient is not sure where the bruise came from, but there is no pain with the bruise.

## 2023-11-13 NOTE — Progress Notes (Signed)
 INTERNAL MEDICINE, BUILDING [facility]  [address]  [place] [address]  Operated by [facility]  Progress Note    Name: JAMIELEE MCHALE MRN:  [id number]   Date: 11/13/2023 DOB:  03/07/1986 (38 y.o.)              Chief Complaint: Bruising       HPI: JESILYN EASOM is a 38 y.o. female who complains of  large bruise left medial upper breast which she woke up w yest am. Pt states she had no injury to the breast and the night before nothing was there. Denies warmth itching redness.  She states it is a little painful but mainly when she pushes or touches it.  There is no nipple discharge.  She has no known family history of breast cancer noted.  She states the only thing that concerns her is her mother had brought up issues with bleeding in her sister which she thought sister was diagnosed with ITP but not 100% sure.  She has no other bruising areas reported.    Allergies:  Allergies   Allergen Reactions    Cefuroxime  Other Adverse Reaction (Add comment)     Other Reaction(s): GI Intolerance    Vomiting? Unsure to just po med.    Cefuroxime Axetil      Vomiting? Unsure to just po med.    Bupropion Itching and  Other Adverse Reaction (Add comment)     Other Reaction(s): Not available       Current Medications:  ascorbic acid, vitamin C, (VITAMIN C) 500 mg Oral Tablet, Take 1 Tablet (500 mg total) by mouth Once a day  cholecalciferol, vitamin D3, 25 mcg (1,000 unit) Oral Tablet, Take 1 Tablet (1,000 Units total) by mouth Once a day  cyclobenzaprine  (FLEXERIL ) 10 mg Oral Tablet, Take 1 Tablet (10 mg total) by mouth Three times a day as needed for Muscle spasms  Ibuprofen  (MOTRIN ) 800 mg Oral Tablet, Take 1 Tablet (800 mg total) by mouth Three times a day as needed for Pain  LORazepam  (ATIVAN ) 0.5 mg Oral Tablet, Take 1 Tablet (0.5 mg total) by mouth Twice per day as needed for Anxiety  magnesium oxide (MAG-OX) 400 mg Oral Tablet, Take 1 Tablet (400 mg total) by mouth Per instructions  Tuesday,thursday  pantoprazole  (PROTONIX ) 40 mg Oral Tablet, Delayed Release (E.C.), Take 1 Tablet (40 mg total) by mouth Once a day  penicillin  V potassium (VEETID) 500 mg Oral Tablet, Take 1 Tablet (500 mg total) by mouth Four times a day  polysaccharide iron  complex (POLY-IRON ) 150 mg iron  Oral Capsule, Take 1 Capsule (150 mg total) by mouth Every Monday, Wednesday and Friday  solifenacin  (VESICARE ) 5 mg Oral Tablet, Take 1 Tablet (5 mg total) by mouth Once a day  zinc sulfate (ZINCATE) 50 mg zinc (220 mg) Oral Capsule, Take 1 Capsule (50 mg total) by mouth Once a day  clarithromycin  (BIAXIN ) 500 mg Oral Tablet, Take 1 Tablet (500 mg total) by mouth Twice daily    No facility-administered medications prior to visit.       Past Medical History:   Diagnosis Date    Anxiety     Depression     Esophageal reflux     Iron  deficiency anemia, unspecified     Syncope     Unspecified urinary incontinence     Weight loss due to medication            Social History  Tobacco Use    Smoking status: Never    Smokeless tobacco: Never   Vaping Use    Vaping status: Never Used   Substance Use Topics    Alcohol use: Never    Drug use: Never       OBJECTIVE:  Vitals:    11/13/23 0852   BP: 112/72   Pulse: 77   SpO2: 98%   Weight: 95.3 kg (210 lb)   Height: 1.6 m (5\' 3" )   BMI: 37.2      Physical Exam  Vitals and nursing note reviewed.   Constitutional:       Appearance: Normal appearance.   Skin:     General: Skin is warm.      Comments: See attached photo concerning area of left breast   Neurological:      Mental Status: She is alert and oriented to person, place, and time.   Psychiatric:         Mood and Affect: Mood normal.         Behavior: Behavior normal.         Thought Content: Thought content normal.         Judgment: Judgment normal.        There is no mass identified on palpation of left breast  Area of concern is tender however on palpation  No axillary lymph nodes identified  Would estimate total circumference of  area just slightly larger than size of a golf ball             Assessment & Plan  Contusion of left breast, initial encounter  Upon examination several weight underlying dot like areas noted question whether contact with NSAID such as a spider could be noted.  There was no central pustule or any signs of contact however noted.  Patient to use warm compresses on area and monitor if any changes return to clinic.  I did give her a topical steroid cream to use b.i.d. p.r.n.  Labs were obtained in office  Encouraged to increase vitamin E in diet  Breast pain  Will order mammogram of left breast and ultrasound following if needed.  It was recommended by tech that bruising be completely resolved prior to mammogram.     Note patient also requested rx for zepbound  to see if her insurance would now cover-med previously use before    PLAN: Treatment per orders . Call or return to clinic prn if these symptoms worsen or fail to improve as anticipated.  Orders Placed This Encounter    MAMMO UNILAT DIAGNOSTIC LEFT-ADDL VIEWS/BREAST US  AS REQ BY RAD    CBC/DIFF    COMPREHENSIVE METABOLIC PNL, FASTING    CBC WITH DIFF    tirzepatide , weight loss, (ZEPBOUND ) 2.5 mg/0.5 mL Subcutaneous Pen Injector    triamcinolone  acetonide 0.1 % Cream      Post-Discharge Follow Up Appointments       Tuesday Nov 25, 2023    Return Patient Visit with Cloyce Darby, PA-C at  3:00 PM      Monday Mar 08, 2024    Return Patient Visit with Cloyce Darby, PA-C at  3:00 PM      Tuesday Apr 20, 2024    Return Patient Visit with Arma Berkshire, PA-C at  3:30 PM      Internal Medicine, Building A  Building [facility], [place]  [address]  [place] [address]  [phone] Urology, [facility] [place], [place]  [address]  [place] [address]  [phone]             This note was partially created using MModal Fluency Direct system (voice recognition software) and is inherently subject to errors including those  of syntax and "sound-alike" substitutions which may escape proofreading.  In such instances, original meaning may be extrapolated by contextual derivation.    Dicky Boer, PA-C

## 2023-11-14 ENCOUNTER — Ambulatory Visit (INDEPENDENT_AMBULATORY_CARE_PROVIDER_SITE_OTHER): Payer: Self-pay

## 2023-11-21 ENCOUNTER — Encounter (INDEPENDENT_AMBULATORY_CARE_PROVIDER_SITE_OTHER): Payer: Self-pay | Admitting: Physician Assistant

## 2023-11-21 ENCOUNTER — Other Ambulatory Visit: Payer: Self-pay

## 2023-11-21 ENCOUNTER — Ambulatory Visit: Attending: Physician Assistant | Admitting: Physician Assistant

## 2023-11-21 ENCOUNTER — Telehealth (INDEPENDENT_AMBULATORY_CARE_PROVIDER_SITE_OTHER): Payer: Self-pay | Admitting: Physician Assistant

## 2023-11-21 VITALS — BP 142/94 | HR 84 | Ht 63.0 in | Wt 210.0 lb

## 2023-11-21 DIAGNOSIS — J020 Streptococcal pharyngitis: Secondary | ICD-10-CM | POA: Insufficient documentation

## 2023-11-21 DIAGNOSIS — R52 Pain, unspecified: Secondary | ICD-10-CM | POA: Insufficient documentation

## 2023-11-21 DIAGNOSIS — R5382 Chronic fatigue, unspecified: Secondary | ICD-10-CM | POA: Insufficient documentation

## 2023-11-21 DIAGNOSIS — R0981 Nasal congestion: Secondary | ICD-10-CM | POA: Insufficient documentation

## 2023-11-21 DIAGNOSIS — R112 Nausea with vomiting, unspecified: Secondary | ICD-10-CM | POA: Insufficient documentation

## 2023-11-21 DIAGNOSIS — R590 Localized enlarged lymph nodes: Secondary | ICD-10-CM | POA: Insufficient documentation

## 2023-11-21 DIAGNOSIS — R490 Dysphonia: Secondary | ICD-10-CM | POA: Insufficient documentation

## 2023-11-21 DIAGNOSIS — Z20828 Contact with and (suspected) exposure to other viral communicable diseases: Secondary | ICD-10-CM | POA: Insufficient documentation

## 2023-11-21 LAB — POCT RAPID COVID-19 & FLU (AMB ONLY)
COVID-19 AG: NEGATIVE
INFLUENZA TYPE A: NEGATIVE
INFLUENZA TYPE B: NEGATIVE

## 2023-11-21 MED ORDER — OSELTAMIVIR 75 MG CAPSULE
75.0000 mg | ORAL_CAPSULE | Freq: Every day | ORAL | 0 refills | Status: AC
Start: 2023-11-21 — End: 2023-12-01

## 2023-11-21 MED ORDER — ONDANSETRON 4 MG DISINTEGRATING TABLET
4.0000 mg | ORAL_TABLET | Freq: Three times a day (TID) | ORAL | 0 refills | Status: DC | PRN
Start: 2023-11-21 — End: 2024-01-05

## 2023-11-21 MED ORDER — PROMETHAZINE-DM 6.25 MG-15 MG/5 ML ORAL SYRUP
5.0000 mL | ORAL_SOLUTION | Freq: Four times a day (QID) | ORAL | 0 refills | Status: AC | PRN
Start: 2023-11-21 — End: 2023-11-28

## 2023-11-21 MED ORDER — AZITHROMYCIN 250 MG TABLET
ORAL_TABLET | ORAL | 1 refills | Status: DC
Start: 2023-11-21 — End: 2024-01-05

## 2023-11-21 MED ORDER — TRIAMCINOLONE ACETONIDE 40 MG/ML SUSPENSION FOR INJECTION
40.0000 mg | INTRAMUSCULAR | Status: AC
Start: 2023-11-21 — End: 2023-11-21
  Administered 2023-11-21: 40 mg via INTRAMUSCULAR

## 2023-11-21 NOTE — Progress Notes (Signed)
 INTERNAL MEDICINE, BUILDING A  [address]  [place] [address]  Operated by [facility]  Progress Note    Name: [name] [name] MRN:  [id number]   Date: 11/21/2023 DOB:  July 05, 1986 (37 y.o.)              Chief Complaint: General Illness       HPI: [name] [name] is a 38 y.o. female who complains of  recurrent sore throat/hoarseness w nausea vomiting this am.  Patient states she went to work this morning but got sick when she arrived to school and vomited x1.  She states the sore throat along with feeling of swelling in her throat started along w fever last night and woke up sweating.  She has had a  +mild dry cough w  body aches sinus congestion also noted which just started last night as well.  Increased fatigue noted.  Pt was just treated for strep  on 3/20 and finished 10 day course of penicillin .  She  states sx went away then returned stating it " hit her hard last night".  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      Allergies:  Allergies   Allergen Reactions    Cefuroxime  Other Adverse Reaction (Add comment)     Other Reaction(s): GI Intolerance    Vomiting? Unsure to just po med.    Cefuroxime Axetil      Vomiting? Unsure to just po med.    Bupropion Itching and  Other Adverse Reaction (Add comment)     Other Reaction(s): Not available       Current Medications:  ascorbic acid, vitamin C, (VITAMIN C) 500 mg Oral Tablet, Take 1 Tablet (500 mg total) by mouth Once a day  cholecalciferol, vitamin D3, 25 mcg (1,000 unit) Oral Tablet, Take 1 Tablet (1,000 Units total) by mouth Once a day  cyclobenzaprine  (FLEXERIL ) 10 mg Oral Tablet, Take 1 Tablet (10 mg total) by mouth Three times a day as needed for Muscle spasms  Ibuprofen  (MOTRIN ) 800 mg Oral Tablet, Take 1 Tablet (800 mg total) by mouth Three times a day as needed for Pain  LORazepam  (ATIVAN ) 0.5 mg Oral Tablet, Take 1 Tablet (0.5 mg total) by mouth Twice per day as needed  for Anxiety  magnesium oxide (MAG-OX) 400 mg Oral Tablet, Take 1 Tablet (400 mg total) by mouth Per instructions Tuesday,thursday  pantoprazole  (PROTONIX ) 40 mg Oral Tablet, Delayed Release (E.C.), Take 1 Tablet (40 mg total) by mouth Once a day  penicillin  V potassium (VEETID) 500 mg Oral Tablet, Take 1 Tablet (500 mg total) by mouth Four times a day  polysaccharide iron  complex (POLY-IRON ) 150 mg iron  Oral Capsule, Take 1 Capsule (150 mg total) by mouth Every Monday, Wednesday and Friday  solifenacin  (VESICARE ) 5 mg Oral Tablet, Take 1 Tablet (5 mg total) by mouth Once a day  tirzepatide , weight loss, (ZEPBOUND ) 2.5 mg/0.5 mL Subcutaneous Pen Injector, Inject 0.5 mL (2.5 mg total) under the skin Every 7 days  zinc sulfate (ZINCATE) 50 mg zinc (220 mg) Oral Capsule, Take 1 Capsule (50 mg total) by mouth Once a day    No facility-administered medications prior to visit.       Past Medical History:   Diagnosis Date    Anxiety     Depression     Esophageal reflux     Iron  deficiency anemia, unspecified  Syncope     Unspecified urinary incontinence     Weight loss due to medication            Social History     Tobacco Use    Smoking status: Never    Smokeless tobacco: Never   Vaping Use    Vaping status: Never Used   Substance Use Topics    Alcohol use: Never    Drug use: Never       OBJECTIVE:  Vitals:    11/21/23 0919   BP: (!) 142/94   Pulse: 84   SpO2: 99%   Weight: 95.3 kg (210 lb)   Height: 1.6 m (5\' 3" )   BMI: 37.2      Physical Exam  Vitals and nursing note reviewed.   Constitutional:       Appearance: Normal appearance.   HENT:      Right Ear: Tympanic membrane normal.      Left Ear: Tympanic membrane normal.      Ears:      Comments: No sinus cavity tenderness on palpation     Mouth/Throat:      Mouth: Mucous membranes are moist.      Pharynx: Posterior oropharyngeal erythema (enlarged cryptic tonsils bilateral with redness exudates on left) present.   Eyes:      Pupils: Pupils are equal, round, and  reactive to light.   Cardiovascular:      Rate and Rhythm: Normal rate and regular rhythm.      Pulses: Normal pulses.   Pulmonary:      Effort: Pulmonary effort is normal.      Breath sounds: Normal breath sounds.   Abdominal:      Palpations: Abdomen is soft.      Tenderness: There is no abdominal tenderness.   Musculoskeletal:      Cervical back: Normal range of motion and neck supple.   Lymphadenopathy:      Cervical: Cervical adenopathy (Enlarged bilateral tonsillar nodes left-greater-than-right) present.   Skin:     General: Skin is warm.      Findings: No rash.   Neurological:      Mental Status: She is alert and oriented to person, place, and time.   Psychiatric:         Mood and Affect: Mood normal.         Behavior: Behavior normal.         Thought Content: Thought content normal.         Judgment: Judgment normal.          Rapid Flu Results:   Rapid Flu A Result: Negative  Rapid Flu B Result: Negative     Rapid Strep Results:   Rapid Strep: Negative     COVID Results   Negative            Assessment & Plan  Strep pharyngitis  Suspected recurrent strep pharyngitis due to physical exam with throat culture obtained  Rapid however today was negative  Z-Pak as directed with refill given instructions to continue for a total of 10 days  Cervical lymphadenopathy  Kenalog  injection given  Hoarseness  Warm salt gargles p.r.n.  Nausea & vomiting  Zofran  ODT 4 mg q.8 hours PRN  Increase hydration  Exposure to the flu  Tamiflu  75 mg daily times 10 days  Sinus congestion  May use humidifier Flonase  as directed  Body aches  Increase rest vitamin-C zinc  Chronic fatigue  PLAN: Treatment per orders . Call or return to clinic prn if these symptoms worsen or fail to improve as anticipated.  Orders Placed This Encounter    THROAT CULTURE, BETA HEMOLYTIC STREPTOCOCCUS    POCT Rapid Covid & Flu (AMB Only)    POCT RAPID STREP A    oseltamivir  (TAMIFLU ) 75 mg Oral Capsule    promethazine -dextromethorphan (PHENERGAN -DM)  6.25-15 mg/5 mL Oral Syrup    azithromycin  (ZITHROMAX ) 250 mg Oral Tablet    ondansetron  (ZOFRAN  ODT) 4 mg Oral Tablet, Rapid Dissolve    triamcinolone  acetonide (KENALOG -40) 40 mg/mL injection      Post-Discharge Follow Up Appointments       Tuesday Nov 25, 2023    Return Patient Visit with Cloyce Darby, PA-C at  3:00 PM      Monday Mar 08, 2024    Return Patient Visit with Cloyce Darby, PA-C at  3:00 PM      Tuesday Apr 20, 2024    Return Patient Visit with Arma Berkshire, PA-C at  3:30 PM      Internal Medicine, Building A  Building Zackary Heron  [address]  [place] [address]  [phone] Urology, [facility] Professional [place], [place]  [address]  [place] [address]  [phone]             This note was partially created using MModal Fluency Direct system (voice recognition software) and is inherently subject to errors including those of syntax and "sound-alike" substitutions which may escape proofreading.  In such instances, original meaning may be extrapolated by contextual derivation.    Tuana Hoheisel, PA-C

## 2023-11-21 NOTE — Nursing Note (Signed)
 Patient is in the office today with complaints of sore throat, ears hurting, body aches, and vomiting. Patient states that her symptoms started 2 days ago.

## 2023-11-21 NOTE — Nursing Note (Signed)
 11/21/23 0900   Rapid Flu   Time Performed 0932   Rapid Flu A Result Negative   Rapid Flu B Result Negative   POCT Instrument Sofia   Initials KD,CMA   Rapid Strep   Time Performed 0932   Rapid Strep Negative   Ordering Physician Cloyce Darby, PA-C   POCT Instrument Sofia   Nurse initials [facility]

## 2023-11-23 LAB — THROAT CULTURE, BETA HEMOLYTIC STREPTOCOCCUS: THROAT CULTURE: NORMAL

## 2023-11-24 ENCOUNTER — Ambulatory Visit (INDEPENDENT_AMBULATORY_CARE_PROVIDER_SITE_OTHER): Payer: Self-pay

## 2023-11-24 NOTE — Telephone Encounter (Signed)
-----   Message from Cloyce Darby, [place] sent at 11/24/2023 10:33 AM EDT -----  Tell pos group c strep   Ask if feels better and make sure got referral to ent

## 2023-11-25 ENCOUNTER — Ambulatory Visit (INDEPENDENT_AMBULATORY_CARE_PROVIDER_SITE_OTHER): Payer: Self-pay | Admitting: Physician Assistant

## 2023-11-27 ENCOUNTER — Telehealth (INDEPENDENT_AMBULATORY_CARE_PROVIDER_SITE_OTHER): Payer: Self-pay | Admitting: Physician Assistant

## 2023-11-27 NOTE — Telephone Encounter (Signed)
 I called and notified patient that if she only is losing her voice but no fever, achiness, or gland swelling she can use humidifier, warm salt gargles, and voice rest. She stated she just had the sore throat and voice loss.

## 2023-12-03 ENCOUNTER — Ambulatory Visit (INDEPENDENT_AMBULATORY_CARE_PROVIDER_SITE_OTHER): Payer: Self-pay | Admitting: Physician Assistant

## 2023-12-09 ENCOUNTER — Telehealth (INDEPENDENT_AMBULATORY_CARE_PROVIDER_SITE_OTHER): Payer: Self-pay | Admitting: Physician Assistant

## 2023-12-10 ENCOUNTER — Encounter (INDEPENDENT_AMBULATORY_CARE_PROVIDER_SITE_OTHER): Payer: Self-pay | Admitting: Physician Assistant

## 2023-12-10 ENCOUNTER — Other Ambulatory Visit: Payer: Self-pay

## 2023-12-10 ENCOUNTER — Ambulatory Visit: Attending: Physician Assistant | Admitting: Physician Assistant

## 2023-12-10 VITALS — HR 100 | Temp 98.3°F

## 2023-12-10 DIAGNOSIS — J029 Acute pharyngitis, unspecified: Secondary | ICD-10-CM | POA: Insufficient documentation

## 2023-12-10 DIAGNOSIS — R058 Other specified cough: Secondary | ICD-10-CM | POA: Insufficient documentation

## 2023-12-10 DIAGNOSIS — Z8709 Personal history of other diseases of the respiratory system: Secondary | ICD-10-CM | POA: Insufficient documentation

## 2023-12-10 DIAGNOSIS — R5383 Other fatigue: Secondary | ICD-10-CM | POA: Insufficient documentation

## 2023-12-10 DIAGNOSIS — R590 Localized enlarged lymph nodes: Secondary | ICD-10-CM | POA: Insufficient documentation

## 2023-12-10 DIAGNOSIS — R52 Pain, unspecified: Secondary | ICD-10-CM | POA: Insufficient documentation

## 2023-12-10 LAB — POCT RAPID COVID (SOFIA) (AMB ONLY)
COVID-19 AG: NEGATIVE
INTERNAL CONTROL: NEGATIVE

## 2023-12-10 LAB — POCT RAPID FLU
INFLUENZA TYPE A: NEGATIVE
INFLUENZA TYPE B: NEGATIVE

## 2023-12-10 LAB — POCT RAPID STREP A: RAPID STREP A (POCT): NEGATIVE

## 2023-12-10 MED ORDER — PROMETHAZINE-DM 6.25 MG-15 MG/5 ML ORAL SYRUP
5.0000 mL | ORAL_SOLUTION | Freq: Four times a day (QID) | ORAL | 0 refills | Status: DC | PRN
Start: 2023-12-10 — End: 2024-01-05

## 2023-12-10 MED ORDER — CEPHALEXIN 500 MG CAPSULE
500.0000 mg | ORAL_CAPSULE | Freq: Four times a day (QID) | ORAL | 0 refills | Status: DC
Start: 2023-12-10 — End: 2024-01-05

## 2023-12-10 MED ORDER — METHYLPREDNISOLONE SOD SUCC 125 MG SOLUTION FOR INJECTION WRAPPER
125.0000 mg | Freq: Once | INTRAVENOUS | Status: AC
Start: 2023-12-10 — End: 2023-12-10
  Administered 2023-12-10: 125 mg via INTRAMUSCULAR

## 2023-12-10 NOTE — Progress Notes (Signed)
 INTERNAL MEDICINE, BUILDING A  [address]  [place] [address]  Operated by [facility]  Progress Note    Name: [name] [name] MRN:  [id number]   Date: 12/10/2023 DOB:  [birth year] (37 y.o.)              Chief Complaint: Feel Sick (Started Sunday   chest congestion  that burns   chills   cough green stuff  sore throat  )       HPI: [name] [name] is a 38 y.o. female who complains of  recurrent sore throat body aches fatigue cough chest congestion which started Sunday night.  Patient has had sick issues since Christmas and most recently recurrent strep throat x 3 for which she has been treated with Augmentin Zithromax and Pen-VK.  She does have a history of large cryptic tonsils and strep throat noted prior to this recurrent episode.  Is also noted the rapid strep so in office previous visit were negative however culture showed strep group  Referral to the ENT has been made however appointment is not until May 12th.  Patient states her throat feels swollen again her glands are tender enlarged.  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 At this time no fever shortness a breath wheezing noted but she does have hoarseness.    Allergies:  Allergies   Allergen Reactions    Cefuroxime  Other Adverse Reaction (Add comment)     Other Reaction(s): GI Intolerance    Vomiting? Unsure to just po med.    Cefuroxime Axetil      Vomiting? Unsure to just po med.    Bupropion Itching and  Other Adverse Reaction (Add comment)     Other Reaction(s): Not available       Current Medications:  ascorbic acid, vitamin C, (VITAMIN C) 500 mg Oral Tablet, Take 1 Tablet (500 mg total) by mouth Once a day  azithromycin (ZITHROMAX) 250 mg Oral Tablet, Take 500 mg (2 tab) on day 1; take 250 mg (1 tab) on days 2-5.  cholecalciferol, vitamin D3, 25 mcg (1,000 unit) Oral Tablet, Take 1 Tablet (1,000 Units total) by mouth Once a day  cyclobenzaprine (FLEXERIL) 10 mg Oral Tablet,  Take 1 Tablet (10 mg total) by mouth Three times a day as needed for Muscle spasms  Ibuprofen (MOTRIN) 800 mg Oral Tablet, Take 1 Tablet (800 mg total) by mouth Three times a day as needed for Pain  LORazepam (ATIVAN) 0.5 mg Oral Tablet, Take 1 Tablet (0.5 mg total) by mouth Twice per day as needed for Anxiety  magnesium oxide (MAG-OX) 400 mg Oral Tablet, Take 1 Tablet (400 mg total) by mouth Per instructions Tuesday,thursday  ondansetron (ZOFRAN ODT) 4 mg Oral Tablet, Rapid Dissolve, Take 1 Tablet (4 mg total) by mouth Every 8 hours as needed for Nausea/Vomiting  pantoprazole (PROTONIX) 40 mg Oral Tablet, Delayed Release (E.C.), Take 1 Tablet (40 mg total) by mouth Once a day  polysaccharide iron complex (POLY-IRON) 150 mg iron Oral Capsule, Take 1 Capsule (150 mg total) by mouth Every Monday, Wednesday and Friday  solifenacin (VESICARE) 5 mg Oral Tablet, Take 1 Tablet (5 mg total) by mouth Once a day  tirzepatide, weight loss, (ZEPBOUND) 2.5 mg/0.5 mL Subcutaneous Pen Injector, Inject 0.5 mL (2.5 mg total) under the skin Every 7 days  zinc sulfate (ZINCATE) 50 mg zinc (220 mg) Oral Capsule, Take 1 Capsule (50 mg total) by  mouth Once a day  penicillin V potassium (VEETID) 500 mg Oral Tablet, Take 1 Tablet (500 mg total) by mouth Four times a day    No facility-administered medications prior to visit.       Past Medical History:   Diagnosis Date    Anxiety     Depression     Esophageal reflux     Iron deficiency anemia, unspecified     Syncope     Unspecified urinary incontinence     Weight loss due to medication            Social History     Tobacco Use    Smoking status: Never    Smokeless tobacco: Never   Vaping Use    Vaping status: Never Used   Substance Use Topics    Alcohol use: Never    Drug use: Never       OBJECTIVE:  Vitals:    12/10/23 1513   Pulse: 100   Temp: 36.8 C (98.3 F)   SpO2: 98%      Physical Exam  Vitals and nursing note reviewed.   Constitutional:       Appearance: Normal appearance.    HENT:      Right Ear: Tympanic membrane normal.      Left Ear: Tympanic membrane normal.      Mouth/Throat:      Mouth: Mucous membranes are moist.      Pharynx: Posterior oropharyngeal erythema present.      Comments: Enlarged cryptic tonsils noted with redness however no exudates  Hoarseness noted  Eyes:      Pupils: Pupils are equal, round, and reactive to light.   Cardiovascular:      Rate and Rhythm: Normal rate and regular rhythm.      Pulses: Normal pulses.   Pulmonary:      Effort: Pulmonary effort is normal.      Breath sounds: Normal breath sounds. No wheezing or rhonchi.   Abdominal:      Palpations: Abdomen is soft.      Tenderness: There is no abdominal tenderness.   Musculoskeletal:      Cervical back: Normal range of motion and neck supple.   Lymphadenopathy:      Cervical: Cervical adenopathy (Tender enlarged bilateral tonsillar nodes bilateral) present.   Skin:     General: Skin is warm.      Findings: No rash.   Neurological:      Mental Status: She is alert and oriented to person, place, and time.   Psychiatric:         Mood and Affect: Mood normal.         Behavior: Behavior normal.         Thought Content: Thought content normal.         Judgment: Judgment normal.          Rapid Flu Results:    Negative     Rapid Strep Results:    Negative     COVID Results   Negative         Assessment & Plan  Sore throat  Rapid strep negative will obtain culture  Keflex 500 mg q.i.d. x7 days  Warm salt gargles p.r.n.  Cervical lymphadenopathy  Solu-Medrol 125 mg IM given  Continue Tylenol Motrin p.r.n.  History of strep sore throat  Patient has appointment with the ENT concerning recurrent strep throat on May 12th  Respiratory tract congestion with cough  Prometh DM p.r.n.  cough  May use humidifier as needed  Fatigue, unspecified type  Increase rest vitamin-C zinc  Body aches  Increase hydration       PLAN: Treatment per orders . Call or return to clinic prn if these symptoms worsen or fail to improve as  anticipated.  Orders Placed This Encounter    THROAT CULTURE, BETA HEMOLYTIC STREPTOCOCCUS    POCT Rapid Strep    POCT Rapid Covid [facility])    POCT Rapid Flu    methylPREDNISolone sod succ (SOLU-medrol) 125 mg/2 mL injection    promethazine-dextromethorphan (PHENERGAN-DM) 6.25-15 mg/5 mL Oral Syrup    cephalexin (KEFLEX) 500 mg Oral Capsule      Post-Discharge Follow Up Appointments       Monday Jan 05, 2024    New Patient Visit with Elnora Morrison, FNP-BC at  9:30 AM      Thursday Jan 15, 2024    Return Patient Visit with Eyvonne Mechanic, PA-C at  3:00 PM      Monday Mar 08, 2024    Return Patient Visit with Eyvonne Mechanic, PA-C at  3:00 PM      Tuesday Apr 20, 2024    Return Patient Visit with Laretta Alstrom, PA-C at  3:30 PM      ENT, [facility], [place]  [address]  [place] [address]  [phone] Internal Medicine, Building A  Building Rowland Lathe  [address]  [place] [address]  [phone] Urology, [facility] Professional [place], [place]  [address]  [place] [address]  [phone]             This note was partially created using MModal Fluency Direct system (voice recognition software) and is inherently subject to errors including those of syntax and "sound-alike" substitutions which may escape proofreading.  In such instances, original meaning may be extrapolated by contextual derivation.    Loralai Eisman, PA-C

## 2023-12-13 LAB — THROAT CULTURE, BETA HEMOLYTIC STREPTOCOCCUS: THROAT CULTURE: NORMAL

## 2023-12-15 ENCOUNTER — Ambulatory Visit (INDEPENDENT_AMBULATORY_CARE_PROVIDER_SITE_OTHER): Payer: Self-pay

## 2023-12-18 ENCOUNTER — Telehealth (INDEPENDENT_AMBULATORY_CARE_PROVIDER_SITE_OTHER): Payer: Self-pay | Admitting: Physician Assistant

## 2023-12-18 MED ORDER — PENICILLIN V POTASSIUM 500 MG TABLET
500.0000 mg | ORAL_TABLET | Freq: Four times a day (QID) | ORAL | 0 refills | Status: DC
Start: 2023-12-18 — End: 2024-01-05

## 2024-01-05 ENCOUNTER — Encounter (INDEPENDENT_AMBULATORY_CARE_PROVIDER_SITE_OTHER): Payer: Self-pay | Admitting: NURSE PRACTITIONER

## 2024-01-05 ENCOUNTER — Ambulatory Visit: Attending: NURSE PRACTITIONER | Admitting: NURSE PRACTITIONER

## 2024-01-05 ENCOUNTER — Ambulatory Visit (INDEPENDENT_AMBULATORY_CARE_PROVIDER_SITE_OTHER): Payer: Self-pay | Admitting: NURSE PRACTITIONER

## 2024-01-05 ENCOUNTER — Other Ambulatory Visit: Payer: Self-pay

## 2024-01-05 VITALS — Ht 63.0 in | Wt 200.0 lb

## 2024-01-05 DIAGNOSIS — Z8619 Personal history of other infectious and parasitic diseases: Secondary | ICD-10-CM

## 2024-01-05 DIAGNOSIS — J3501 Chronic tonsillitis: Secondary | ICD-10-CM | POA: Insufficient documentation

## 2024-01-05 DIAGNOSIS — J020 Streptococcal pharyngitis: Secondary | ICD-10-CM | POA: Insufficient documentation

## 2024-01-05 DIAGNOSIS — R490 Dysphonia: Secondary | ICD-10-CM | POA: Insufficient documentation

## 2024-01-05 NOTE — H&P (Signed)
 ENT, [facility]  [address]  [place] [address]  Phone: [phone]  Fax: [phone]      Encounter Date: 01/05/2024    Patient ID: [name] [name]  MRN: [id number]    DOB: [birth year]  Age: 38 y.o. female         Referring Provider:    Graylin Lea, Amy, PA-C  [address]  [place] [address]  [place],  [place] [address]    Reason for Visit:   Chief Complaint   Patient presents with    Strep Throat     Patient complains of recurrent strep throat. Pt states about 6 times in the last year. Pt states she does snore        History of Present Illness:  [name] [name] is a 38 y.o. female referred for recurrent strep.  She states will typically get strep 2 times yearly, but this past year has had 6 or more episodes.  Will have fatigue, sore throat, fever, and nausea with each episode.  She has developed hoarseness with the past several strep episodes.      Patient History:  Patient Active Problem List   Diagnosis    Depression    Iron  deficiency anemia, unspecified    Weight loss due to medication    Anxiety    Unspecified urinary incontinence    Esophageal reflux    Elevated triglycerides with high cholesterol    Abnormal glucose    Chronic fatigue    Syncope    Symptomatic cholelithiasis    Pre-op exam    PONV (postoperative nausea and vomiting)    Oral contraceptive pill surveillance    History of heart surgery    Encounter for postoperative care    Obesity, unspecified    Hypomagnesemia    Vitamin D  deficiency    Dysautonomia orthostatic hypotension syndrome    Midline low back pain with right-sided sciatica    Lumbar radiculopathy, right    HNP (herniated nucleus pulposus), lumbar     Current Outpatient Medications   Medication Sig    ascorbic acid, vitamin C, (VITAMIN C) 500 mg Oral Tablet Take 1 Tablet (500 mg total) by mouth Daily    cholecalciferol, vitamin D3, 25 mcg (1,000 unit) Oral Tablet Take 1 Tablet (1,000 Units total) by mouth Daily    cyclobenzaprine  (FLEXERIL ) 10 mg Oral Tablet Take 1 Tablet (10 mg total)  by mouth Three times a day as needed for Muscle spasms    Ibuprofen  (MOTRIN ) 800 mg Oral Tablet Take 1 Tablet (800 mg total) by mouth Three times a day as needed for Pain    LORazepam  (ATIVAN ) 0.5 mg Oral Tablet Take 1 Tablet (0.5 mg total) by mouth Twice per day as needed for Anxiety    magnesium oxide (MAG-OX) 400 mg Oral Tablet Take 1 Tablet (400 mg total) by mouth Per instructions Tuesday,thursday    pantoprazole  (PROTONIX ) 40 mg Oral Tablet, Delayed Release (E.C.) Take 1 Tablet (40 mg total) by mouth Once a day    polysaccharide iron  complex (POLY-IRON ) 150 mg iron  Oral Capsule Take 1 Capsule (150 mg total) by mouth Every Monday, Wednesday and Friday    solifenacin  (VESICARE ) 5 mg Oral Tablet Take 1 Tablet (5 mg total) by mouth Once a day    zinc sulfate (ZINCATE) 50 mg zinc (220 mg) Oral Capsule Take 1 Capsule (50 mg total) by mouth Daily     Allergies   Allergen Reactions    Cefuroxime  Other Adverse  Reaction (Add comment)     Other Reaction(s): GI Intolerance    Vomiting? Unsure to just po med.    Cefuroxime Axetil      Vomiting? Unsure to just po med.    Bupropion Itching and  Other Adverse Reaction (Add comment)     Other Reaction(s): Not available     Past Medical History:   Diagnosis Date    Anxiety     Depression     Esophageal reflux     Iron  deficiency anemia, unspecified     Syncope     Unspecified urinary incontinence     Weight loss due to medication       Past Surgical History:   Procedure Laterality Date    HX APPENDECTOMY      HX CHOLECYSTECTOMY      HX SALPINGECTOMY        Family Medical History:       Problem Relation (Age of Onset)    Arthritis-osteo Father    Coronary Artery Disease Mother    Diabetes type II Maternal Grandmother    Hypothyroidism Sister    Kidney Stones Maternal Grandmother            Social History     Tobacco Use    Smoking status: Never    Smokeless tobacco: Never   Vaping Use    Vaping status: Never Used   Substance Use Topics    Alcohol use: Never    Drug use: Never        Review of Systems     Vitals:    01/05/24 1006   Weight: 90.7 kg (200 lb)   Height: 1.6 m (5\' 3" )   BMI: 35.43      ENT Physical Exam  Constitutional  Appearance: patient appears well-developed and well-groomed, obesity noted,  Communication/Voice: communication appropriate for developmental age; vocal quality normal;  Head and Face  Appearance: head appears normal, face appears normal and face appears atraumatic;  Palpation: facial palpation normal;  Salivary: glands normal;  Ear  Hearing: intact;  Auricles: right auricle normal; left auricle normal;  External Mastoids: right external mastoid normal; left external mastoid normal;  Ear Canals: right ear canal normal; left ear canal normal;  Tympanic Membranes: right tympanic membrane normal; left tympanic membrane normal;  Nose  External Nose: nares patent bilaterally; external nose normal;  Internal Nose: nasal mucosa normal; septum normal; bilateral inferior turbinates normal;  Oral Cavity/Oropharynx  Lips: normal;  Teeth: normal;  Gums: gingiva normal;  Tongue: normal;  Oral mucosa: normal;  Hard palate: normal;  Tonsils: bilateral tonsils 2+, cryptic;  Neck  Neck: neck normal; neck palpation normal;  Thyroid : thyroid  normal;  Respiratory  Inspection: breathing unlabored; normal breathing rate;  Lymphatic  Palpation: no cervical adenopathy noted;  Neurovestibular  Mental Status: alert and oriented;  Psychiatric: mood normal; affect is appropriate;  Cranial Nerves: cranial nerves intact;       Assessment:  ENCOUNTER DIAGNOSES     ICD-10-CM   1. Chronic tonsillitis  J35.01   2. 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 Hoarseness  R49.0       Plan:  Medical records reviewed on 01/05/2024.    Discussed risks, benefits, and alternatives to Tonsillectomy.  She is agreeable to the procedure.  Will follow up with Dr. Sharron Deeds for Tonsillectomy.   Laryngoscopy exam normal      No orders of the defined types were placed in this encounter.    No  follow-ups  on file.    Junette Older, FNP-BC  01/05/2024, 10:22

## 2024-01-05 NOTE — Procedures (Signed)
 ENT, [facility]  [address]  [place] [address]  Operated by [facility]  Procedure Note    Name: [name] [name] MRN:  [id number]   Date: 01/05/2024 DOB:  [birth year] (37 y.o.)         [address] - LARYNGOSCOPY, FLEXIBLE DIAGNOSTIC (AMB ONLY)    Performed by: Junette Older, FNP-BC  Authorized by: Junette Older, FNP-BC    Time Out:     Immediately before the procedure, a time out was called:  Yes    Patient verified:  Yes    Procedure Verified:  Yes    Site Verified:  Yes  Documentation:      ENT, [facility]  [address]  [place] [address]  Operated by [facility]  Procedure Note    Name: [name] [name] MRN:  [id number]  Date: 01/05/2024 DOB:  October 16, 1985 (37 y.o.)        @PROCDOC @    Indications for procedure: Unable to perform indirect laryngoscopy secondary to gag reflex, Unable to fully visualize laryngeal anatomy on indirect laryngoscopy, and Hoarseness    Anesthesia: Oxymetazoline nasal spray    Description: The flexible endoscope was gently introduced into the nostril and passed along the floor of the nose to the nasopharynx. Adenoid was minimal and eustachian tubes normal. The retropalatal airway was patent.    The endoscope was passed to the oropharynx. Base of tongue displayed normal lingual tonsils, patent valelulla, and sharply defined upright epiglottis. Retrolingual airway was patent.    The larynx displayed normal true vocal cords with good mobility. False cords were normal. Arytenoid mucosa was pink with no edema.     The piriform recesses were symmetric without secretion. The hypopharynx was symmetric without lesion.    Findings: Symmetrical true vocal fold motion    The patient tolerated the procedure well.    Junette Older, FNP-BC                 Junette Older, FNP-BC

## 2024-01-15 ENCOUNTER — Ambulatory Visit (INDEPENDENT_AMBULATORY_CARE_PROVIDER_SITE_OTHER): Admitting: Physician Assistant

## 2024-02-06 ENCOUNTER — Ambulatory Visit (INDEPENDENT_AMBULATORY_CARE_PROVIDER_SITE_OTHER): Payer: Self-pay | Admitting: OTOLARYNGOLOGY

## 2024-02-06 NOTE — H&P (Unsigned)
 ENT, [facility]  [address]  [place] [address]  Operated by [facility]      Name: [name] [name] MRN:  [id number]   Date: 02/06/2024 DOB: 1985/11/03 (37 y.o.)       Referring Provider:  No ref. provider found    Reason for Visit: No chief complaint on file.       History of Present Illness:  [name] [name] is a 38 y.o. female who presents for follow-up on chronic adenotonsillitis, adenotonsillar hypertrophy/AR.  Patient reports greater than 5 to 6  episodes of adenotonsillitis per year over the last few years.  Multiple strep positive.  Gets exudates, swollen glands and fevers with acute episodes.  Multiple antibiotics.  *** Chronic nasal congestion.  History of gastroesophageal reflux disease on Protonix       Patient History:  Problem List[1]  Current Outpatient Medications   Medication Sig    ascorbic acid, vitamin C, (VITAMIN C) 500 mg Oral Tablet Take 1 Tablet (500 mg total) by mouth Daily    cholecalciferol, vitamin D3, 25 mcg (1,000 unit) Oral Tablet Take 1 Tablet (1,000 Units total) by mouth Daily    cyclobenzaprine  (FLEXERIL ) 10 mg Oral Tablet Take 1 Tablet (10 mg total) by mouth Three times a day as needed for Muscle spasms    Ibuprofen  (MOTRIN ) 800 mg Oral Tablet Take 1 Tablet (800 mg total) by mouth Three times a day as needed for Pain    LORazepam  (ATIVAN ) 0.5 mg Oral Tablet Take 1 Tablet (0.5 mg total) by mouth Twice per day as needed for Anxiety    magnesium oxide (MAG-OX) 400 mg Oral Tablet Take 1 Tablet (400 mg total) by mouth Per instructions Tuesday,thursday    pantoprazole  (PROTONIX ) 40 mg Oral Tablet, Delayed Release (E.C.) Take 1 Tablet (40 mg total) by mouth Once a day    polysaccharide iron  complex (POLY-IRON ) 150 mg iron  Oral Capsule Take 1 Capsule (150 mg total) by mouth Every Monday, Wednesday and Friday    solifenacin  (VESICARE ) 5 mg Oral Tablet Take 1 Tablet (5 mg total) by mouth Once a day    zinc sulfate (ZINCATE) 50 mg zinc (220 mg) Oral Capsule  Take 1 Capsule (50 mg total) by mouth Daily      Allergies[2]  Past Medical History:   Diagnosis Date    Anxiety     Depression     Esophageal reflux     Iron  deficiency anemia, unspecified     Syncope     Unspecified urinary incontinence     Weight loss due to medication      Past Surgical History:   Procedure Laterality Date    HX APPENDECTOMY      HX CHOLECYSTECTOMY      HX SALPINGECTOMY       Family Medical History:       Problem Relation (Age of Onset)    Arthritis-osteo Father    Coronary Artery Disease Mother    Diabetes type II Maternal Grandmother    Hypothyroidism Sister    Kidney Stones Maternal Grandmother            Social History[3]    Review of Systems:  Review of Systems    Physical Exam:  There were no vitals taken for this visit.      Physical Exam  Constitutional:       Appearance: Normal appearance. She is well-developed, well-groomed and normal weight.   HENT:      Head: Normocephalic  and atraumatic.      Right Ear: Hearing, tympanic membrane, ear canal and external ear normal.      Left Ear: Hearing, tympanic membrane, ear canal and external ear normal.      Nose: Septal deviation and mucosal edema present.      Right Turbinates: Enlarged.      Left Turbinates: Enlarged.      Mouth/Throat:      Lips: Pink.      Mouth: Mucous membranes are moist.      Pharynx: Oropharynx is clear. Uvula midline.      Tonsils: 2+ on the right. 2+ on the left.   Eyes:      Extraocular Movements: Extraocular movements intact.   Neck:      Trachea: Phonation normal.   Pulmonary:      Effort: Pulmonary effort is normal.   Musculoskeletal:      Cervical back: Normal range of motion and neck supple.   Lymphadenopathy:      Cervical: No cervical adenopathy.   Skin:     General: Skin is warm.   Neurological:      Mental Status: She is alert and oriented to person, place, and time.      Cranial Nerves: Cranial nerves 2-12 are intact. No facial asymmetry.   Psychiatric:         Attention and Perception: Attention normal.          Mood and Affect: Mood normal.         Speech: Speech normal.         Behavior: Behavior normal. Behavior is cooperative.          Assessment:  ENCOUNTER DIAGNOSES     ICD-10-CM   1. 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 Non-seasonal allergic rhinitis due to other allergic trigger  J30.89   4. Nasal turbinate hypertrophy  J34.3   5. Gastroesophageal reflux disease, unspecified whether esophagitis present  K21.9       Plan:  Previous provider note and Medical records reviewed on 02/06/2024.  Plan T&A,*** bilateral inferior turbinate reduction, risks, benefits, alternatives discussed at length.  All questions answered.  Patient wished to proceed.  Check preoperative labs  Medical clearance  Nasal saline b.i.d.  Diet modifications/reflux precautions  Continue Protonix  daily  No orders of the defined types were placed in this encounter.      Pennye Boy, DO  ENT / Facial Plastic Surgery    I appreciate the opportunity to be involved in the care of your patients.  If you have any questions or concerns regarding this encounter, please do not hesitate to contact me at your convenience.        This note may have been partially generated using MModal Fluency Direct system, and there may be some incorrect words, spellings, and punctuation that were not noted in checking the note before saving, though effort was made to avoid such errors.       [1]   Patient Active Problem List  Diagnosis    Depression    Iron  deficiency anemia, unspecified    Weight loss due to medication    Anxiety    Unspecified urinary incontinence    Esophageal reflux    Elevated triglycerides with high cholesterol    Abnormal glucose    Chronic fatigue    Syncope    Symptomatic cholelithiasis    Pre-op exam    PONV (postoperative nausea and vomiting)  Oral contraceptive pill surveillance    History of heart surgery    Encounter for postoperative care    Obesity, unspecified    Hypomagnesemia     Vitamin D  deficiency    Dysautonomia orthostatic hypotension syndrome    Midline low back pain with right-sided sciatica    Lumbar radiculopathy, right    HNP (herniated nucleus pulposus), lumbar   [2]   Allergies  Allergen Reactions    Cefuroxime  Other Adverse Reaction (Add comment)     Other Reaction(s): GI Intolerance    Vomiting? Unsure to just po med.    Cefuroxime Axetil      Vomiting? Unsure to just po med.    Bupropion Itching and  Other Adverse Reaction (Add comment)     Other Reaction(s): Not available   [3]   Social History  Tobacco Use    Smoking status: Never    Smokeless tobacco: Never   Vaping Use    Vaping status: Never Used   Substance Use Topics    Alcohol use: Never    Drug use: Never

## 2024-02-10 ENCOUNTER — Ambulatory Visit (INDEPENDENT_AMBULATORY_CARE_PROVIDER_SITE_OTHER): Admitting: Physician Assistant

## 2024-02-13 ENCOUNTER — Ambulatory Visit (INDEPENDENT_AMBULATORY_CARE_PROVIDER_SITE_OTHER): Payer: Self-pay | Admitting: OTOLARYNGOLOGY

## 2024-03-08 ENCOUNTER — Ambulatory Visit (INDEPENDENT_AMBULATORY_CARE_PROVIDER_SITE_OTHER): Payer: Self-pay | Admitting: Physician Assistant

## 2024-03-11 ENCOUNTER — Other Ambulatory Visit (INDEPENDENT_AMBULATORY_CARE_PROVIDER_SITE_OTHER): Payer: Self-pay | Admitting: Physician Assistant

## 2024-03-24 ENCOUNTER — Other Ambulatory Visit (INDEPENDENT_AMBULATORY_CARE_PROVIDER_SITE_OTHER): Payer: Self-pay | Admitting: Physician Assistant

## 2024-03-28 ENCOUNTER — Other Ambulatory Visit (INDEPENDENT_AMBULATORY_CARE_PROVIDER_SITE_OTHER): Payer: Self-pay | Admitting: Physician Assistant

## 2024-03-31 ENCOUNTER — Telehealth (INDEPENDENT_AMBULATORY_CARE_PROVIDER_SITE_OTHER): Payer: Self-pay | Admitting: Physician Assistant

## 2024-04-01 ENCOUNTER — Telehealth (INDEPENDENT_AMBULATORY_CARE_PROVIDER_SITE_OTHER): Payer: Self-pay | Admitting: Physician Assistant

## 2024-04-01 ENCOUNTER — Other Ambulatory Visit (INDEPENDENT_AMBULATORY_CARE_PROVIDER_SITE_OTHER): Payer: Self-pay | Admitting: Physician Assistant

## 2024-04-01 MED ORDER — LORAZEPAM 0.5 MG TABLET
0.5000 mg | ORAL_TABLET | Freq: Two times a day (BID) | ORAL | 0 refills | Status: DC | PRN
Start: 2024-04-01 — End: 2024-04-19

## 2024-04-01 NOTE — Nursing Note (Signed)
 Ptient called requesting ativan  refill, patient was advised that per Amy she needed to be seen to get a refill. Patient was hateful stated she was out and what did she need to do

## 2024-04-01 NOTE — Telephone Encounter (Signed)
 Medication already previously denied by Amy, patient needs an appointment to have medication refilled.

## 2024-04-19 ENCOUNTER — Other Ambulatory Visit: Payer: Self-pay

## 2024-04-19 ENCOUNTER — Ambulatory Visit: Payer: Self-pay | Attending: Physician Assistant | Admitting: Physician Assistant

## 2024-04-19 ENCOUNTER — Encounter (INDEPENDENT_AMBULATORY_CARE_PROVIDER_SITE_OTHER): Payer: Self-pay | Admitting: Physician Assistant

## 2024-04-19 VITALS — BP 102/78 | HR 68 | Ht 63.0 in | Wt 227.0 lb

## 2024-04-19 DIAGNOSIS — R590 Localized enlarged lymph nodes: Secondary | ICD-10-CM | POA: Insufficient documentation

## 2024-04-19 DIAGNOSIS — R635 Abnormal weight gain: Secondary | ICD-10-CM | POA: Insufficient documentation

## 2024-04-19 DIAGNOSIS — E782 Mixed hyperlipidemia: Secondary | ICD-10-CM | POA: Insufficient documentation

## 2024-04-19 DIAGNOSIS — F419 Anxiety disorder, unspecified: Secondary | ICD-10-CM | POA: Insufficient documentation

## 2024-04-19 DIAGNOSIS — J0301 Acute recurrent streptococcal tonsillitis: Secondary | ICD-10-CM | POA: Insufficient documentation

## 2024-04-19 DIAGNOSIS — D509 Iron deficiency anemia, unspecified: Secondary | ICD-10-CM | POA: Insufficient documentation

## 2024-04-19 MED ORDER — PHENTERMINE 37.5 MG TABLET
37.5000 mg | ORAL_TABLET | Freq: Every morning | ORAL | 0 refills | Status: AC
Start: 2024-04-19 — End: 2024-07-18

## 2024-04-19 MED ORDER — LORAZEPAM 0.5 MG TABLET
0.5000 mg | ORAL_TABLET | Freq: Two times a day (BID) | ORAL | 0 refills | Status: AC | PRN
Start: 2024-04-19 — End: ?

## 2024-04-19 MED ORDER — CYCLOBENZAPRINE 10 MG TABLET
10.0000 mg | ORAL_TABLET | Freq: Three times a day (TID) | ORAL | 1 refills | Status: DC | PRN
Start: 2024-04-19 — End: 2024-06-30

## 2024-04-19 MED ORDER — IBUPROFEN 800 MG TABLET
800.0000 mg | ORAL_TABLET | Freq: Three times a day (TID) | ORAL | 1 refills | Status: DC | PRN
Start: 2024-04-19 — End: 2024-07-15

## 2024-04-19 MED ORDER — AMOXICILLIN 875 MG-POTASSIUM CLAVULANATE 125 MG TABLET
1.0000 | ORAL_TABLET | Freq: Two times a day (BID) | ORAL | 0 refills | Status: AC
Start: 2024-04-19 — End: 2024-04-29

## 2024-04-19 MED ORDER — TRIAMCINOLONE ACETONIDE 40 MG/ML SUSPENSION FOR INJECTION
40.0000 mg | INTRAMUSCULAR | Status: AC
Start: 2024-04-19 — End: 2024-04-19
  Administered 2024-04-19: 40 mg via INTRAMUSCULAR

## 2024-04-19 MED ORDER — PANTOPRAZOLE 40 MG TABLET,DELAYED RELEASE
40.0000 mg | DELAYED_RELEASE_TABLET | Freq: Every day | ORAL | 1 refills | Status: DC
Start: 2024-04-19 — End: 2024-07-15

## 2024-04-19 MED ORDER — SOLIFENACIN 5 MG TABLET
5.0000 mg | ORAL_TABLET | Freq: Every day | ORAL | 1 refills | Status: DC
Start: 2024-04-19 — End: 2024-07-15

## 2024-04-19 NOTE — Assessment & Plan Note (Signed)
-  will check lipids in lab  -continue diet weight loss efforts

## 2024-04-19 NOTE — Assessment & Plan Note (Signed)
-  will check iron  levels in lab  -Currently on polysaccharide iron  complex therapy.

## 2024-04-19 NOTE — Nursing Note (Signed)
 Patient presents today with complaints of a sore throat.

## 2024-04-19 NOTE — Nursing Note (Deleted)
 Patient presents today as a routine annual exam.

## 2024-04-19 NOTE — Nursing Note (Signed)
 04/19/24 1600   Rapid Strep   Time Performed 1616   Rapid Strep Positive   Ordering Physician Greig Seats, PA-C   Nurse initials KD,CMA

## 2024-04-19 NOTE — Assessment & Plan Note (Signed)
-  will check magnesium in lab  -continue magnesium oxide therapy.

## 2024-04-19 NOTE — Progress Notes (Signed)
 [facility]  [address]  [place] [address]          Follow Up        Reason for Visit: General Illnessmed refill    History of Present Illness  [name] [name] is a 38 year old female who presents with hoarseness scratchy throat and morning nasal congestion.  She was scheduled today to have her routine follow-up in labs as well.    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 She experiences hoarseness without associated soreness but states she has a little scratchy throat and noticed her tonsils are enlarged. She notes waking up with significant nasal congestion that resolves as the day progresses. No fever, chills, or body aches are present.  She does have a history of recurrent strep pharyngitis for which she was scheduled to have her tonsils removed last year but failed to follow through with the surgery.    She complains as well of weight gain in with previously on 1 of the injectables but her insurance stopped pain so she is wanting something oral if possible to help her lose weight.  Weight is currently up to 227 with a BMI of 40.21.  Patient states she thinks years back she may have tried 1 of the oral weight loss medicines but she can not remember the name but denied any side effects or issues when taking.    Follow-up cholesterol/lipids for which patient has a history of some elevation in the cholesterol triglycerides and LDL levels but she is currently not on statin therapy.  Once again she lost weight in his now weight back on so needs follow-up labs.    Follow-up iron  deficiency for which she is currently on iron  supplement several times a week.  She still has fatigue issues off and on but denies abdominal pain blood in the stool.  Needs lab to check iron  levels.    Follow-up magnesium with history of low levels noted.  At this time she is is taking magnesium supplement and denies any leg cramping.    Patient also has a history of anxiety issues for  which she takes Ativan  0.5 mg b.i.d. PRN.  Patient does appear to be a lot of her anxiety to her work but denies panic attacks or depression symptoms.  She does not use medication daily majority of times notes that when needed it is usually in the evenings to help her rest.  Requesting medication refills.       PHQ Questionnaire         Past Medical History:   Diagnosis Date    Anxiety     Depression     Esophageal reflux     Iron  deficiency anemia, unspecified     Syncope     Unspecified urinary incontinence     Weight loss due to medication          Past Surgical History:   Procedure Laterality Date    CYSTOSCOPY; LASER LITHOTRIPSY; RIGHT URETEROSCOPY; RIGHT RETROGRADE PYELOGRAM; RIGHT STENT PLACEMENT Right 06/25/2023    Performed by Cutrone, Joseph, DO at [facility]    FLEXIBLE CYSTOSCOPY WITH RIGHT URETERAL STENT REMOVAL Right 06/30/2023    Performed by Cutrone, Joseph, DO at [facility] T[facility]D    HX APPENDECTOMY      HX CHOLECYSTECTOMY      HX SALPINGECTOMY        Family Medical History:  Problem Relation (Age of Onset)    Arthritis-osteo Father    Coronary Artery Disease Mother    Diabetes type II Maternal Grandmother    Hypothyroidism Sister    Kidney Stones Maternal Grandmother            Social History[1]  Medication:  ascorbic acid, vitamin C, (VITAMIN C) 500 mg Oral Tablet, Take 1 Tablet (500 mg total) by mouth Daily  cholecalciferol, vitamin D3, 25 mcg (1,000 unit) Oral Tablet, Take 1 Tablet (1,000 Units total) by mouth Daily  magnesium oxide (MAG-OX) 400 mg Oral Tablet, Take 1 Tablet (400 mg total) by mouth Per instructions Tuesday,thursday  polysaccharide iron  complex (POLY-IRON ) 150 mg iron  Oral Capsule, Take 1 Capsule (150 mg total) by mouth Every Monday, Wednesday and Friday  zinc sulfate (ZINCATE) 50 mg zinc (220 mg) Oral Capsule, Take 1 Capsule (50 mg total) by mouth Daily  cyclobenzaprine  (FLEXERIL ) 10 mg Oral Tablet, Take 1 Tablet (10 mg total) by mouth Three times a day as needed for Muscle  spasms  Ibuprofen  (MOTRIN ) 800 mg Oral Tablet, Take 1 Tablet (800 mg total) by mouth Three times a day as needed for Pain  LORazepam  (ATIVAN ) 0.5 mg Oral Tablet, Take 1 Tablet (0.5 mg total) by mouth Twice per day as needed for Anxiety  pantoprazole  (PROTONIX ) 40 mg Oral Tablet, Delayed Release (E.C.), TAKE 1 TABLET by mouth DAILY  solifenacin  (VESICARE ) 5 mg Oral Tablet, Take 1 Tablet (5 mg total) by mouth Once a day    No facility-administered medications prior to visit.    Allergies:Allergies[2]    Physical Exam:  Vitals:    04/19/24 1535   BP: 102/78   Pulse: 68   SpO2: 99%   Weight: 103 kg (227 lb)   Height: 1.6 m (5' 3)   BMI: 40.21      Patient is overweight  General appearance: alert, cooperative, in no acute distress  HEENT: Ears: clear, no effusion, no erythema;   Throat:  Large cryptic tonsils bilateral with exudates right tonsil noted  Posterior pharynx with erythema  +tender enlarged tonsillar nodes bilat   Lungs: clear to auscultation bilaterally; no wheezes or rhonchi   Heart: regular rate and rhythm; normal S1 & S2; no murmur  Abdomen: soft, non-tender,obese not distended; bowel sounds present  Extremities: extremities normal ROM, no cyanosis or edema , no rash  Psych: alert and oriented x 3  Assessment & Plan  Iron  deficiency anemia, unspecified iron  deficiency anemia type  -will check iron  levels in lab  -Currently on polysaccharide iron  complex therapy.  Hypomagnesemia  -will check magnesium in lab  -continue magnesium oxide therapy.  Weight gain  Abnormal weight gain  Discussed alternative to Tirzepatide  due to insurance issues. Considered Adipex (phentermine ) despite previous amphetamine use with unknown effectiveness. Explained potential side effects.  - Prescribed Adipex 37.5 mg daily before breakfast for 90 days.  - Monitor for side effects: fast heart rate, palpitations, headaches, dizziness.  Anxiety  -Ativan  0.5 mg b.i.d. PRN-refills given  Elevated triglycerides with high  cholesterol  -will check lipids in lab  -continue diet weight loss efforts  Recurrent streptococcal tonsillitis  -Augmentin  875 b.i.d. times 10 days  -warm salt gargles PRN  -continue Tylenol  Motrin  PRN  -follow-up with ENT as scheduled  Cervical lymphadenopathy  -Kenalog  injection IM administered in office  -increase best fluids vitamin-C      Orders Placed This Encounter    CBC/DIFF    COMPREHENSIVE METABOLIC PANEL, NON-FASTING  LIPID PANEL    THYROID  STIMULATING HORMONE WITH FREE T4 REFLEX    IRON  TRANSFERRIN AND TIBC    MAGNESIUM    CBC WITH DIFF    HELP LAB ORDER    HELP LAB ORDER    HELP LAB ORDER    HELP LAB ORDER    HELP LAB ORDER    HELP LAB ORDER    POCT RAPID STREP A    LORazepam  (ATIVAN ) 0.5 mg Oral Tablet    cyclobenzaprine  (FLEXERIL ) 10 mg Oral Tablet    Ibuprofen  (MOTRIN ) 800 mg Oral Tablet    pantoprazole  (PROTONIX ) 40 mg Oral Tablet, Delayed Release (E.C.)    solifenacin  (VESICARE ) 5 mg Oral Tablet    phentermine  (ADIPEX-P ) 37.5 mg Oral Tablet    triamcinolone  acetonide (KENALOG -40) 40 mg/mL injection    amoxicillin -pot clavulanate (AUGMENTIN ) 875-125 mg Oral Tablet           Follow up:   Post-Discharge Follow Up Appointments       Tuesday Apr 20, 2024    Return Patient Visit with Waverly Grate, PA-C at  3:30 PM      Friday Jul 02, 2024    Return Patient Visit with Irving Anes, DO at  8:30 AM      Thursday Jul 15, 2024    Return Patient Visit with Scharlene No, PA-C at  3:00 PM      ENT, [facility] [place]  [address]  [place] [address]  [phone] Internal Medicine, [facility]  [facility]  [address]  [place] [address]  [phone] Urology, [facility] [place], [place]  [address]  [place] [address]  [phone]           Seek medical attention for new or worsening symptoms.  Patient has been seen in this clinic within the last 3 years.     Damian Hofstra, PA-C          This note was  partially created using MModal Fluency Direct system (voice recognition software) and is inherently subject to errors including those of syntax and sound-alike substitutions which may escape proofreading.  In such instances, original meaning may be extrapolated by contextual derivation.       [1]   Social History  Tobacco Use    Smoking status: Never    Smokeless tobacco: Never   Vaping Use    Vaping status: Never Used   Substance Use Topics    Alcohol use: Never    Drug use: Never   [2]   Allergies  Allergen Reactions    Cefuroxime  Other Adverse Reaction (Add comment)     Other Reaction(s): GI Intolerance    Vomiting? Unsure to just po med.    Cefuroxime Axetil      Vomiting? Unsure to just po med.    Bupropion Itching and  Other Adverse Reaction (Add comment)     Other Reaction(s): Not available

## 2024-04-19 NOTE — Assessment & Plan Note (Signed)
-  Ativan  0.5 mg b.i.d. PRN-refills given

## 2024-04-20 ENCOUNTER — Encounter (INDEPENDENT_AMBULATORY_CARE_PROVIDER_SITE_OTHER): Payer: Self-pay | Admitting: Physician Assistant

## 2024-04-20 LAB — CBC WITH DIFF
BASOPHIL #: 0 x10ˆ3/uL (ref 0.00–0.10)
BASOPHIL %: 1 % (ref 0–1)
EOSINOPHIL #: 0 x10ˆ3/uL (ref 0.00–0.50)
EOSINOPHIL %: 1 % (ref 1–7)
HCT: 40.9 % (ref 31.2–41.9)
HGB: 14.5 g/dL — ABNORMAL HIGH (ref 10.9–14.3)
LYMPHOCYTE #: 1.7 x10ˆ3/uL (ref 1.10–3.10)
LYMPHOCYTE %: 35 % (ref 16–46)
MCH: 30.2 pg (ref 24.7–32.8)
MCHC: 35.4 g/dL (ref 32.3–35.6)
MCV: 85.5 fL (ref 75.5–95.3)
MONOCYTE #: 0.3 x10ˆ3/uL (ref 0.20–0.90)
MONOCYTE %: 7 % (ref 4–11)
MPV: 8.5 fL (ref 7.9–10.8)
NEUTROPHIL #: 2.8 x10ˆ3/uL (ref 1.90–8.20)
NEUTROPHIL %: 57 % (ref 43–77)
PLATELETS: 298 x10ˆ3/uL (ref 140–440)
RBC: 4.78 x10ˆ6/uL (ref 3.63–4.92)
RDW: 12.6 % (ref 12.3–17.7)
WBC: 4.9 x10ˆ3/uL (ref 3.8–11.8)

## 2024-04-20 NOTE — Addendum Note (Signed)
 Addended by: JULI VERNELL NORRIS on: 04/20/2024 08:20 AM     Modules accepted: Orders

## 2024-04-20 NOTE — Addendum Note (Signed)
 Addended by: JULI VERNELL NORRIS on: 04/20/2024 07:58 AM     Modules accepted: Orders

## 2024-04-22 LAB — QUEST MISC ORDER - REFRIGERATED

## 2024-04-23 LAB — QUEST MISC ORDER - REFRIGERATED

## 2024-04-26 LAB — QUEST MISC ORDER - REFRIGERATED

## 2024-04-27 LAB — QUEST MISC ORDER - REFRIGERATED

## 2024-04-29 LAB — QUEST MISC ORDER - REFRIGERATED

## 2024-05-03 ENCOUNTER — Ambulatory Visit (INDEPENDENT_AMBULATORY_CARE_PROVIDER_SITE_OTHER): Payer: Self-pay

## 2024-06-04 ENCOUNTER — Ambulatory Visit (INDEPENDENT_AMBULATORY_CARE_PROVIDER_SITE_OTHER): Payer: Self-pay | Admitting: OTOLARYNGOLOGY

## 2024-06-13 ENCOUNTER — Other Ambulatory Visit (INDEPENDENT_AMBULATORY_CARE_PROVIDER_SITE_OTHER): Payer: Self-pay | Admitting: Physician Assistant

## 2024-06-17 ENCOUNTER — Other Ambulatory Visit (INDEPENDENT_AMBULATORY_CARE_PROVIDER_SITE_OTHER): Payer: Self-pay | Admitting: Physician Assistant

## 2024-06-17 MED ORDER — AZITHROMYCIN 250 MG TABLET
ORAL_TABLET | ORAL | 0 refills | Status: DC
Start: 2024-06-17 — End: 2024-06-30

## 2024-06-21 ENCOUNTER — Telehealth (INDEPENDENT_AMBULATORY_CARE_PROVIDER_SITE_OTHER): Payer: Self-pay | Admitting: Physician Assistant

## 2024-06-21 NOTE — Telephone Encounter (Signed)
 Called pt back. Confirmed 2 pt verifiers by name and date of birth. Pt wanted to move appointment up due to pain increase. Moved appointment up for pt. Pt verbalized understanding.    Waddell Moats, CMA

## 2024-06-21 NOTE — Telephone Encounter (Signed)
 Incoming call from this patient. Would like a sooner appointment with a PM&R provider than what is available. Patient has been scheduled with Dr. Obrien (06/30/2024) for a possible injection request, but does not wish to wait that long as she cannot walk.

## 2024-06-21 NOTE — Progress Notes (Signed)
 Patient called today stating that she is having 8/10 burning, tingling pain down the right lateral thigh, lateral calf into the foot which is the exact same as approximately 1 year ago.  Pain began last Wednesday and had been well-managed for nearly 1 year after a right L5-S1 TFESI. We will plan to order the same injection for her since this was so effective in the past for managing her pain.

## 2024-06-22 NOTE — Progress Notes (Signed)
 PROCEDURE     1. A right L5-S1 transforaminal epidural injection under fluoroscopic guidance.     2. Use of fluoroscopy was required to insure adequate delivery of medication into the epidural space and around the spinal nerve.     The patient was monitored with continuous pulse oximetry during the procedure.     DIAGNOSIS/INDICATIONS FOR THE PROCEDURE: Patient with LBP and right sided radiculitis. Due to their pain they were set up for this injection.  Patient has previously had a right L5-S1 TFESI and had significant pain relief for greater than 1 year.  For specific information please refer to their history and physical and request for this.     IMPRESSION/RESULTS: The patient tolerated the procedure well without any complications.  Although she got slightly finished her warm near the end of the injection but that resolved when we completed the procedure and her pulse and vitals remained stable.  She improved within a few minutes with a cold towel and laying supine.    FOLLOW-UP APPOINTMENT:  in 2-3 weeks    INFORMED CONSENT: The patient understood the potential risks and benefits of the procedure which were explained to the patient prior to the procedure. The patient read and signed the consent stating complete understanding of this information and wished to proceed with the procedure. Ample time was given for any questions to be answered prior to the procedure. The risks of the procedure were explained including, but not limited to the risk of bleeding and/or infection into the epidural space or spine, nerve injury, nerve irritation, allergic or adverse reaction to medications, etc. No promises were given to any expected outcome.     PROCEDURE: A time out was initially performed. The patient was sterilely prepped and draped with a triple scrub of betadine solution in the prone position. Careful aseptic technique was used throughout the procedure. The neuroforamen was identified under fluoroscopic guidance using  an oblique posterior-lateral approach. The skin and subcutaneous tissues were anesthetized with approximately 2-3 cc. of 1% Xylocaine  if needed. Then, a 25-gauge spinal needle was inserted down into the foramen. A small amount of Isovue M 200 (approximately 1-3 cc) was infiltrated under real time fluoroscopy, which eventually demonstrated satisfactory spread along the spinal. No arterial or venous flow was noted during the injection of the contrast prior to injecting any steroid or anesthetic and no aspirate was noted. Spot films were taken. Then, a solution containing 1.5 cc. of Dexamethasone  (10mg /cc, preservative free) mixed with 1.5 cc of 1% xylocaine  preservative free was slowly infiltrated around the nerve and into the epidural space. There was good spread and eventual washout of contrast. The needle was then removed, and a Band-Aid was applied if needed. The patient tolerated the procedure well without any complications.  Procedure was performed with Dr. Allen and myself.    DISCHARGE SUMMARY: The patient was monitored for at least 10 minutes where they remained stable without any evidence of complications. The patient was discharged with discharge instructions in stable condition. If the patient has any problems, they were instructed to contact us .    Portions of this note are dictated using Paediatric Nurse. Variances in spelling and vocabulary are possible and unintentional. Not all errors are caught/corrected. Please notify the dino if any significant discrepancies are noted or if the meaning of any statement is not clear.    Electronically signed by:  Alyce Marsa Allen, DO 06/22/2024 11:07 AM  Interventional Physiatry

## 2024-06-27 ENCOUNTER — Other Ambulatory Visit (INDEPENDENT_AMBULATORY_CARE_PROVIDER_SITE_OTHER): Payer: Self-pay | Admitting: Physician Assistant

## 2024-06-30 ENCOUNTER — Other Ambulatory Visit: Payer: Self-pay

## 2024-06-30 ENCOUNTER — Encounter (INDEPENDENT_AMBULATORY_CARE_PROVIDER_SITE_OTHER): Payer: Self-pay | Admitting: Physician Assistant

## 2024-06-30 ENCOUNTER — Ambulatory Visit: Attending: Physician Assistant | Admitting: Physician Assistant

## 2024-06-30 VITALS — BP 108/82 | HR 90 | Ht 63.0 in | Wt 223.0 lb

## 2024-06-30 DIAGNOSIS — M545 Low back pain, unspecified: Secondary | ICD-10-CM | POA: Insufficient documentation

## 2024-06-30 DIAGNOSIS — G90A Postural orthostatic tachycardia syndrome (POTS): Secondary | ICD-10-CM | POA: Insufficient documentation

## 2024-06-30 DIAGNOSIS — Z9181 History of falling: Secondary | ICD-10-CM | POA: Insufficient documentation

## 2024-06-30 DIAGNOSIS — M5416 Radiculopathy, lumbar region: Secondary | ICD-10-CM | POA: Insufficient documentation

## 2024-06-30 DIAGNOSIS — Z7984 Long term (current) use of oral hypoglycemic drugs: Secondary | ICD-10-CM

## 2024-06-30 MED ORDER — GABAPENTIN 100 MG CAPSULE
ORAL_CAPSULE | ORAL | 1 refills | Status: DC
Start: 2024-06-30 — End: 2024-07-15

## 2024-06-30 MED ORDER — MOUNJARO 2.5 MG/0.5 ML SUBCUTANEOUS PEN INJECTOR
2.5000 mg | PEN_INJECTOR | SUBCUTANEOUS | 2 refills | Status: AC
Start: 2024-06-30 — End: 2024-09-28

## 2024-06-30 MED ORDER — CYCLOBENZAPRINE 10 MG TABLET
10.0000 mg | ORAL_TABLET | Freq: Three times a day (TID) | ORAL | 1 refills | Status: AC | PRN
Start: 2024-06-30 — End: ?

## 2024-06-30 NOTE — Nursing Note (Signed)
 Patient presents today with complaints of back pain after falling and hitting her back on the bathtub 2 weeks ago.

## 2024-06-30 NOTE — Progress Notes (Signed)
 INTERNAL MEDICINE, BUILDING A  [address]  [place] [address]  Operated by [facility]  Progress Note    Name: [name] [name] MRN:  [id number]   Date: 06/30/2024 DOB:  [birth year] (38 y.o.)        Chief Complaint: Back Pain     History of Present Illness  [name] [name] is a 38 year old female who presents with severe right leg pain following a fall.    She experiences severe pain originating from the top of her right buttock and radiating down her right leg, described as burning with significant weakness. The pain began after a fall in the bathtub two weeks ago, where she passed out and fell secondary to her POTS. Initially, she attempted to manage the pain with physical therapy exercises, but the pain worsened, leading to missed work days.    She received an injection at [facility] a week ago, which was performed with imaging guidance. However during the procedure Dr. Patsie significant swelling that is recommended a repeat MRI.  Patient also stated the injection has not provided the expected relief, as she continues to experience severe pain, especially when not taking ibuprofen  800 mg. The pain is worse at night, impacting her ability to sleep, and requiring her to sleep in a recliner with heat applied.    Her current medications include ibuprofen  800 mg, which she takes frequently to manage the pain.  She did have some leftover Flexeril  she states is at least a year to 36 years old that she took yesterday in his seemed to help so requesting an updated prescription.    Once again she has a history of herniated and bulging discs, with a previous MRI indicating one herniated and one partially herniated disc, along with three bulging discs. She has not had any surgeries related to this condition.    She also requests another prescription for Mounjaro  to see if insurance will now cover due to her POTS diagnosis.  She had a history of passing out approximately once a week due to her POTS  but when taking the Mounjaro  did not pass out at all.  States she is not sure as to what it has been it helps her but it does prevent the syncopal episodes.  States her specialist at [facility] had tried to get covered for her as well with the insurance denied but since getting a new insurance she would like to try again.    Recent eval by pain specialist:    Alm Ilah Franklin Mickey., MD  Physician  Specialty: PHYSICAL MEDICINE AND REHABILITATION     Progress Notes     Signed     Encounter Date: 06/23/2024  Note Received: 06/30/24 1510        PROCEDURE      1. A right L5-S1 transforaminal epidural injection under fluoroscopic guidance.      2. Use of fluoroscopy was required to insure adequate delivery of medication into the epidural space and around the spinal nerve.      The patient was monitored with continuous pulse oximetry during the procedure.      DIAGNOSIS/INDICATIONS FOR THE PROCEDURE: Patient with LBP and right sided radiculitis. Due to their pain they were set up for this injection.  Patient has previously had a right L5-S1 TFESI and had significant pain relief for greater than 1 year.  For specific information please refer to their history and physical and request for this.  IMPRESSION/RESULTS: The patient tolerated the procedure well without any complications.  Although she got slightly finished her warm near the end of the injection but that resolved when we completed the procedure and her pulse and vitals remained stable.  She improved within a few minutes with a cold towel and laying supine.     FOLLOW-UP APPOINTMENT:  in 2-3 weeks     INFORMED CONSENT: The patient understood the potential risks and benefits of the procedure which were explained to the patient prior to the procedure. The patient read and signed the consent stating complete understanding of this information and wished to proceed with the procedure. Ample time was given for any questions to be answered prior to the procedure. The  risks of the procedure were explained including, but not limited to the risk of bleeding and/or infection into the epidural space or spine, nerve injury, nerve irritation, allergic or adverse reaction to medications, etc. No promises were given to any expected outcome.      PROCEDURE: A time out was initially performed. The patient was sterilely prepped and draped with a triple scrub of betadine solution in the prone position. Careful aseptic technique was used throughout the procedure. The neuroforamen was identified under fluoroscopic guidance using an oblique posterior-lateral approach. The skin and subcutaneous tissues were anesthetized with approximately 2-3 cc. of 1% Xylocaine  if needed. Then, a 25-gauge spinal needle was inserted down into the foramen. A small amount of Isovue M 200 (approximately 1-3 cc) was infiltrated under real time fluoroscopy, which eventually demonstrated satisfactory spread along the spinal. No arterial or venous flow was noted during the injection of the contrast prior to injecting any steroid or anesthetic and no aspirate was noted. Spot films were taken. Then, a solution containing 1.5 cc. of Dexamethasone  (10mg /cc, preservative free) mixed with 1.5 cc of 1% xylocaine  preservative free was slowly infiltrated around the nerve and into the epidural space. There was good spread and eventual washout of contrast. The needle was then removed, and a Band-Aid was applied if needed. The patient tolerated the procedure well without any complications.  Procedure was performed with Dr. Allen and myself.     DISCHARGE SUMMARY: The patient was monitored for at least 10 minutes where they remained stable without any evidence of complications. The patient was discharged with discharge instructions in stable condition. If the patient has any problems, they were instructed to contact us .     Portions of this note are dictated using Paediatric Nurse. Variances in spelling and  vocabulary are possible and unintentional. Not all errors are caught/corrected. Please notify the dino if any significant discrepancies are noted or if the meaning of any statement is not clear.     Electronically signed by:  Alyce Marsa Allen, DO 06/22/2024 11:07 AM  Interventional Physiatry          Electronically signed by Alm Ilah Franklin Mickey., MD at 06/23/24 1412       Allergies:  Allergies[1]    Current Medications:  ascorbic acid, vitamin C, (VITAMIN C) 500 mg Oral Tablet, Take 1 Tablet (500 mg total) by mouth Daily  cholecalciferol, vitamin D3, 25 mcg (1,000 unit) Oral Tablet, Take 1 Tablet (1,000 Units total) by mouth Daily  cyclobenzaprine  (FLEXERIL ) 10 mg Oral Tablet, Take 1 Tablet (10 mg total) by mouth Three times a day as needed for Muscle spasms  Ibuprofen  (MOTRIN ) 800 mg Oral Tablet, Take 1 Tablet (800 mg total) by mouth Three times a day as  needed for Pain  LORazepam  (ATIVAN ) 0.5 mg Oral Tablet, TAKE 1 TABLET by mouth TWICE DAILY AS NEEDED FOR ANXIETY  magnesium oxide (MAG-OX) 400 mg Oral Tablet, Take 1 Tablet (400 mg total) by mouth Per instructions Tuesday,thursday  pantoprazole  (PROTONIX ) 40 mg Oral Tablet, Delayed Release (E.C.), Take 1 Tablet (40 mg total) by mouth Daily  phentermine  (ADIPEX-P ) 37.5 mg Oral Tablet, TAKE 1 TABLET by mouth EVERY MORNING BEFORE BREAKFAST  polysaccharide iron  complex (FERREX 150) 150 mg iron  Oral Capsule, TAKE 1 CAPSULE by mouth EVERY monday, WEDNESDAY, AND FRIDAY  solifenacin  (VESICARE ) 5 mg Oral Tablet, Take 1 Tablet (5 mg total) by mouth Daily  zinc sulfate (ZINCATE) 50 mg zinc (220 mg) Oral Capsule, Take 1 Capsule (50 mg total) by mouth Daily  azithromycin  (ZITHROMAX ) 250 mg Oral Tablet, Take 500 mg (2 tab) on day 1; take 250 mg (1 tab) on days 2-5.    No facility-administered medications prior to visit.       Past Medical History:   Diagnosis Date    Anxiety     Depression     Esophageal reflux     Iron  deficiency anemia, unspecified     Syncope      Unspecified urinary incontinence     Weight loss due to medication            Social History[2]    OBJECTIVE:  Vitals:    06/30/24 1512   BP: 108/82   Pulse: 90   SpO2: 98%   Weight: 101 kg (223 lb)   Height: 1.6 m (5' 3)   BMI: 39.5        Physical Exam  VITALS: BP- 108/82  CONSTITUTIONAL: She is not in acute distress, Normal appearance.  Patient obese  CARDIOVASCULAR: Normal rate and regular rhythm, Normal pulses, Normal heart sounds.  PULMONARY: Pulmonary effort is normal, Normal breath sounds.  MUSCULOSKELETAL:  Decreased range of motion lower back due to pain  + straight leg raise on the right leg around 30 with positive tripod sign  Pedal pulses equal bilateral  Gait intact  SKIN: Skin is warm, No lesion or rash.  NEUROLOGICAL: No focal deficit present, She is alert and oriented to person, place, and time.  PSYCHIATRIC: Mood normal, Behavior normal, Thought content normal, Judgment normal.       Assessment & Plan  Acute right-sided low back pain, unspecified whether sciatica present  Acute low back pain exacerbated by fall, severe and limits mobility. Current ibuprofen  treatment inadequate.  Recent injection by pain specialist noted significant inflammation leading to concerns of possible herniated disc in need for patient to repeat MRI of lumbar spine.  -order stat MRI of lumbar spine to assess for disc herniation rupture  -prescribe Flexeril  10 mg 1/2-1 q.8 hours PRN-hold if sedation noted  - Continue ibuprofen  800 mg as needed.    Lumbar radiculopathy  Lumbar radiculopathy with right leg pain and weakness  Chronic lumbar radiculopathy with significant right leg pain and weakness, exacerbated by recent fall. Suspected ruptured disc.  - Order stat MRI of lumbar spine to assess for disc herniation or rupture.  - Prescribe Neurontin 100 mg at night initially with slow increase the 300 mg nightly as tolerated  - Prescribe Flexeril  10 mg 1/2-1 q.8 hours PRN, hold the sedation noted  - Advise against concurrent  use of Neurontin and Flexeril  initially.  - Continue ibuprofen  as needed for pain management.  Hx of fall  History of falling with Recent fall exacerbated  back pain and radiculopathy. Possible syncope related to POTS.  POTS (postural orthostatic tachycardia syndrome)  POTS and related syncope for which patient has been seen by specialist.  Symptom improvement noted when taking Mounjaro  previously however insurance denied coverage.  Patient states she has a new insurance now and would like to send the medication again to see if it can be now groove.  -prescribed Mounjaro  2.5 mg weekly       Orders Placed This Encounter    MRI SPINE LUMBOSACRAL WO CONTRAST      Post-Discharge Follow Up Appointments       Wednesday Jun 30, 2024    Return Patient Visit with Scharlene No, PA-C at  3:15 PM      Thursday Jul 15, 2024    Return Patient Visit with Scharlene No, PA-C at  3:00 PM      Internal Medicine, Building A  [address], [place]  [address]  [place] [address]  [phone]             This note was partially created using MModal Fluency Direct system (voice recognition software) and is inherently subject to errors including those of syntax and sound-alike substitutions which may escape proofreading.  In such instances, original meaning may be extrapolated by contextual derivation.    Irie Fiorello, PA-C    This note was created with assistance from [employer] via capture of conversational audio. Consent was obtained from the patient and all parties present prior to recording.             [1]   Allergies  Allergen Reactions    Cefuroxime  Other Adverse Reaction (Add comment)     Other Reaction(s): GI Intolerance    Vomiting? Unsure to just po med.    Cefuroxime Axetil      Vomiting? Unsure to just po med.    Bupropion Itching and  Other Adverse Reaction (Add comment)     Other Reaction(s): Not available   [2]   Social History  Tobacco Use    Smoking status: Never    Smokeless tobacco: Never   Vaping Use    Vaping  status: Never Used   Substance Use Topics    Alcohol use: Never    Drug use: Never

## 2024-07-02 ENCOUNTER — Ambulatory Visit (INDEPENDENT_AMBULATORY_CARE_PROVIDER_SITE_OTHER): Payer: Self-pay | Admitting: OTOLARYNGOLOGY

## 2024-07-03 ENCOUNTER — Ambulatory Visit
Admission: RE | Admit: 2024-07-03 | Discharge: 2024-07-03 | Disposition: A | Source: Ambulatory Visit | Attending: Physician Assistant

## 2024-07-03 ENCOUNTER — Other Ambulatory Visit: Payer: Self-pay

## 2024-07-03 DIAGNOSIS — M4726 Other spondylosis with radiculopathy, lumbar region: Secondary | ICD-10-CM

## 2024-07-03 DIAGNOSIS — Z9181 History of falling: Secondary | ICD-10-CM | POA: Insufficient documentation

## 2024-07-03 DIAGNOSIS — M5416 Radiculopathy, lumbar region: Secondary | ICD-10-CM | POA: Insufficient documentation

## 2024-07-03 DIAGNOSIS — M545 Low back pain, unspecified: Secondary | ICD-10-CM | POA: Insufficient documentation

## 2024-07-05 ENCOUNTER — Ambulatory Visit (INDEPENDENT_AMBULATORY_CARE_PROVIDER_SITE_OTHER): Payer: Self-pay

## 2024-07-13 ENCOUNTER — Telehealth (INDEPENDENT_AMBULATORY_CARE_PROVIDER_SITE_OTHER): Payer: Self-pay | Admitting: OTOLARYNGOLOGY

## 2024-07-15 ENCOUNTER — Ambulatory Visit: Payer: Self-pay | Attending: Physician Assistant | Admitting: Physician Assistant

## 2024-07-15 ENCOUNTER — Other Ambulatory Visit (INDEPENDENT_AMBULATORY_CARE_PROVIDER_SITE_OTHER): Payer: Self-pay | Admitting: Physician Assistant

## 2024-07-15 ENCOUNTER — Other Ambulatory Visit: Payer: Self-pay

## 2024-07-15 ENCOUNTER — Encounter (INDEPENDENT_AMBULATORY_CARE_PROVIDER_SITE_OTHER): Payer: Self-pay | Admitting: Physician Assistant

## 2024-07-15 VITALS — BP 130/76 | HR 98 | Wt 227.0 lb

## 2024-07-15 DIAGNOSIS — Z0000 Encounter for general adult medical examination without abnormal findings: Secondary | ICD-10-CM | POA: Insufficient documentation

## 2024-07-15 MED ORDER — PANTOPRAZOLE 40 MG TABLET,DELAYED RELEASE
40.0000 mg | DELAYED_RELEASE_TABLET | Freq: Every day | ORAL | 1 refills | Status: AC
Start: 2024-07-15 — End: ?

## 2024-07-15 MED ORDER — GABAPENTIN 100 MG CAPSULE
200.0000 mg | ORAL_CAPSULE | Freq: Two times a day (BID) | ORAL | 2 refills | Status: AC
Start: 2024-07-15 — End: ?

## 2024-07-15 MED ORDER — POLYSACCHARIDE IRON COMPLEX 150 MG IRON CAPSULE
150.0000 mg | ORAL_CAPSULE | Freq: Every day | ORAL | Status: DC
Start: 2024-07-15 — End: 2024-07-15

## 2024-07-15 MED ORDER — IBUPROFEN 800 MG TABLET
800.0000 mg | ORAL_TABLET | Freq: Three times a day (TID) | ORAL | 1 refills | Status: AC | PRN
Start: 2024-07-15 — End: ?

## 2024-07-15 MED ORDER — SOLIFENACIN 5 MG TABLET
5.0000 mg | ORAL_TABLET | Freq: Every day | ORAL | 1 refills | Status: AC
Start: 2024-07-15 — End: ?

## 2024-07-15 MED ORDER — METHOCARBAMOL 750 MG TABLET
750.0000 mg | ORAL_TABLET | Freq: Three times a day (TID) | ORAL | 1 refills | Status: AC | PRN
Start: 2024-07-15 — End: 2024-10-13

## 2024-07-15 MED ORDER — LORAZEPAM 0.5 MG TABLET
0.5000 mg | ORAL_TABLET | Freq: Two times a day (BID) | ORAL | 0 refills | Status: AC | PRN
Start: 2024-07-15 — End: ?

## 2024-07-15 MED ORDER — POLYSACCHARIDE IRON COMPLEX 150 MG IRON CAPSULE
150.0000 mg | ORAL_CAPSULE | Freq: Every day | ORAL | 1 refills | Status: AC
Start: 2024-07-15 — End: ?

## 2024-07-15 NOTE — Nursing Note (Signed)
 Patient presents for a yearly exam

## 2024-07-15 NOTE — Progress Notes (Signed)
 INTERNAL MEDICINE, BUILDING A  [address]  [place] [address]  Operated by [facility]  Progress Note    Name: EVANEE LUBRANO MRN:  [id number]   Date: 07/15/2024 DOB:  10-Oct-1985 (38 y.o.)       Chief Complaint   Patient presents with    Annual Wellness Exam       HPI:   Deborha Moseley is a 38 y.o. female here for her yearly well exam and labs.  Patient states that she is feeling well overall and has a Good energy level.  She admits to not eating a good diet all the time but she does have plenty of water intake each day.  She is not exercising currently however due to back pain issues related to a herniated disc. Pt states she is scheduled to have back surgery on dec 9. She says that she averages approximately  7 hours of sleep per night. Last pap 3 years ago and on period today so wanted to wait.       Date of Last Pap Smear:      Last Mammogram: No results found for this or any previous visit (from the past 82479 hours).      Past Medical History  Current Outpatient Medications   Medication Sig    ascorbic acid, vitamin C, (VITAMIN C) 500 mg Oral Tablet Take 1 Tablet (500 mg total) by mouth Daily    cholecalciferol, vitamin D3, 25 mcg (1,000 unit) Oral Tablet Take 1 Tablet (1,000 Units total) by mouth Daily    cyclobenzaprine  (FLEXERIL ) 10 mg Oral Tablet Take 1 Tablet (10 mg total) by mouth Three times a day as needed for Muscle spasms    gabapentin  (NEURONTIN ) 100 mg Oral Capsule Take 1-3 caps at night as directed (max 300 mg nightly)    Ibuprofen  (MOTRIN ) 800 mg Oral Tablet Take 1 Tablet (800 mg total) by mouth Three times a day as needed for Pain    LORazepam  (ATIVAN ) 0.5 mg Oral Tablet TAKE 1 TABLET by mouth TWICE DAILY AS NEEDED FOR ANXIETY    pantoprazole  (PROTONIX ) 40 mg Oral Tablet, Delayed Release (E.C.) Take 1 Tablet (40 mg total) by mouth Daily    phentermine  (ADIPEX-P ) 37.5 mg Oral Tablet TAKE 1 TABLET by mouth EVERY MORNING BEFORE BREAKFAST    polysaccharide iron  complex (FERREX  150) 150 mg iron  Oral Capsule Take 1 Capsule (150 mg total) by mouth Daily    solifenacin  (VESICARE ) 5 mg Oral Tablet Take 1 Tablet (5 mg total) by mouth Daily    tirzepatide  (MOUNJARO ) 2.5 mg/0.5 mL Subcutaneous Pen Injector Inject 0.5 mL (2.5 mg total) under the skin Every 7 days for 90 days Indications: dx POT's/use for heart protective measures    zinc sulfate (ZINCATE) 50 mg zinc (220 mg) Oral Capsule Take 1 Capsule (50 mg total) by mouth Daily     Allergies   Allergen Reactions    Cefuroxime  Other Adverse Reaction (Add comment)     Other Reaction(s): GI Intolerance    Vomiting? Unsure to just po med.    Cefuroxime Axetil      Vomiting? Unsure to just po med.    Bupropion Itching and  Other Adverse Reaction (Add comment)     Other Reaction(s): Not available     Past Medical History:   Diagnosis Date    Anxiety     Depression     Esophageal reflux     Iron  deficiency anemia, unspecified  Syncope     Unspecified urinary incontinence     Weight loss due to medication          Past Surgical History:   Procedure Laterality Date    HX APPENDECTOMY      HX CHOLECYSTECTOMY      HX SALPINGECTOMY           OB History    No obstetric history on file.       Family Medical History:       Problem Relation (Age of Onset)    Arthritis-osteo Father    Coronary Artery Disease Mother    Diabetes type II Maternal Grandmother    Hypothyroidism Sister    Kidney Stones Maternal Grandmother            Social History     Socioeconomic History    Marital status: Married   Tobacco Use    Smoking status: Never    Smokeless tobacco: Never   Vaping Use    Vaping status: Never Used   Substance and Sexual Activity    Alcohol use: Never    Drug use: Never       ROS:  Pertinent positives included in the HPI.     BP 130/76 (Site: Left Arm, Patient Position: Sitting, Cuff Size: Adult)   Pulse 98   Wt 103 kg (227 lb)   LMP 07/14/2024 (Exact Date)   SpO2 100%   BMI 40.21 kg/m       HEENT:  pupils equal and reactive to light, TM's clear,  no nasal congestion, throat nonerythematous  Neck:  no lymphadenopathy. no thyromegaly. no carotid bruits.   Lungs: clear to auscultation bilaterally, normal effort  CV: regular rate and rhythm without murmur, pulses 2+ throughout  Abdomen: soft, non-tender, nondistended, normoactive bowel sounds present  Skin: pink, warm, no rashes present  Musculoskeletal:  no joint deformities, no peripheral edema present, back non-tender on palpation  Neuro:  cranial nerves grossly intact, normal sensation, normal muscle tone and strength  Psych:  alert and oriented x 3, normal mood and affect    Assessment & Plan  Well woman exam (no gynecological exam)  Well exam with well labs ordered today. Will get pap/pelvic exam sometime in the near future.     Orders Placed This Encounter    CBC/DIFF    COMPREHENSIVE METABOLIC PNL, FASTING    URINALYSIS, MACROSCOPIC AND MICROSCOPIC W/CULTURE REFLEX    LIPID PANEL    CBC WITH DIFF    URINALYSIS, MACROSCOPIC    URINALYSIS, MICROSCOPIC    polysaccharide iron  complex (FERREX 150) 150 mg iron  Oral Capsule        Return in 1 year (on 07/15/2025). Well exam    Tery Hoeger, PA-C

## 2024-10-19 ENCOUNTER — Ambulatory Visit (INDEPENDENT_AMBULATORY_CARE_PROVIDER_SITE_OTHER): Payer: Self-pay | Admitting: Physician Assistant

## 2025-04-20 ENCOUNTER — Ambulatory Visit (INDEPENDENT_AMBULATORY_CARE_PROVIDER_SITE_OTHER): Payer: Self-pay | Admitting: Physician Assistant

## 2025-07-18 ENCOUNTER — Ambulatory Visit (INDEPENDENT_AMBULATORY_CARE_PROVIDER_SITE_OTHER): Payer: Self-pay | Admitting: Physician Assistant
